# Patient Record
Sex: Female | Born: 1935 | Race: White | Hispanic: No | Smoking: Never smoker
Health system: Southern US, Community
[De-identification: ages and names within clinical notes are randomized; demographics above are authoritative.]

## PROBLEM LIST (undated history)

## (undated) DIAGNOSIS — R42 Dizziness and giddiness: Secondary | ICD-10-CM

## (undated) DIAGNOSIS — C801 Malignant (primary) neoplasm, unspecified: Secondary | ICD-10-CM

## (undated) DIAGNOSIS — I10 Essential (primary) hypertension: Secondary | ICD-10-CM

## (undated) DIAGNOSIS — K2970 Gastritis, unspecified, without bleeding: Secondary | ICD-10-CM

## (undated) DIAGNOSIS — N952 Postmenopausal atrophic vaginitis: Secondary | ICD-10-CM

## (undated) DIAGNOSIS — J302 Other seasonal allergic rhinitis: Secondary | ICD-10-CM

## (undated) DIAGNOSIS — I639 Cerebral infarction, unspecified: Secondary | ICD-10-CM

## (undated) DIAGNOSIS — E119 Type 2 diabetes mellitus without complications: Secondary | ICD-10-CM

## (undated) DIAGNOSIS — I350 Nonrheumatic aortic (valve) stenosis: Secondary | ICD-10-CM

## (undated) DIAGNOSIS — M549 Dorsalgia, unspecified: Secondary | ICD-10-CM

## (undated) HISTORY — PX: TONSILLECTOMY: SUR1361

## (undated) HISTORY — PX: BACK SURGERY: SHX140

## (undated) HISTORY — PX: BREAST BIOPSY: SHX20

## (undated) HISTORY — DX: Postmenopausal atrophic vaginitis: N95.2

## (undated) HISTORY — PX: ABDOMINAL HYSTERECTOMY: SHX81

## (undated) HISTORY — PX: COLON SURGERY: SHX602

---

## 2004-12-06 ENCOUNTER — Ambulatory Visit: Payer: Self-pay | Admitting: Urology

## 2006-01-03 ENCOUNTER — Ambulatory Visit: Payer: Self-pay | Admitting: Internal Medicine

## 2007-01-06 ENCOUNTER — Ambulatory Visit: Payer: Self-pay | Admitting: Internal Medicine

## 2007-12-25 ENCOUNTER — Ambulatory Visit: Payer: Self-pay | Admitting: Unknown Physician Specialty

## 2008-01-08 ENCOUNTER — Ambulatory Visit: Payer: Self-pay | Admitting: Internal Medicine

## 2008-07-07 ENCOUNTER — Emergency Department: Payer: Self-pay | Admitting: Emergency Medicine

## 2009-02-03 ENCOUNTER — Ambulatory Visit: Payer: Self-pay | Admitting: Internal Medicine

## 2010-02-16 ENCOUNTER — Ambulatory Visit: Payer: Self-pay | Admitting: Internal Medicine

## 2011-04-17 ENCOUNTER — Ambulatory Visit: Payer: Self-pay | Admitting: Internal Medicine

## 2011-05-12 ENCOUNTER — Inpatient Hospital Stay: Payer: Self-pay | Admitting: Internal Medicine

## 2011-05-15 ENCOUNTER — Ambulatory Visit: Payer: Self-pay | Admitting: Physician Assistant

## 2011-06-05 ENCOUNTER — Ambulatory Visit: Payer: Self-pay | Admitting: Unknown Physician Specialty

## 2011-06-14 ENCOUNTER — Ambulatory Visit: Payer: Self-pay | Admitting: Internal Medicine

## 2011-09-19 ENCOUNTER — Observation Stay: Payer: Self-pay | Admitting: Internal Medicine

## 2011-09-19 LAB — CBC
MCH: 25.1 pg — ABNORMAL LOW (ref 26.0–34.0)
MCHC: 31.9 g/dL — ABNORMAL LOW (ref 32.0–36.0)
MCV: 79 fL — ABNORMAL LOW (ref 80–100)
Platelet: 233 10*3/uL (ref 150–440)
RBC: 2.94 10*6/uL — ABNORMAL LOW (ref 3.80–5.20)
RDW: 16.7 % — ABNORMAL HIGH (ref 11.5–14.5)

## 2011-09-19 LAB — COMPREHENSIVE METABOLIC PANEL
Anion Gap: 10 (ref 7–16)
Bilirubin,Total: 0.3 mg/dL (ref 0.2–1.0)
Chloride: 107 mmol/L (ref 98–107)
Co2: 25 mmol/L (ref 21–32)
EGFR (African American): >60
EGFR (Non-African Amer.): 50 — ABNORMAL LOW
Glucose: 190 mg/dL — ABNORMAL HIGH (ref 65–99)
Potassium: 3.7 mmol/L (ref 3.5–5.1)
SGOT(AST): 35 U/L (ref 15–37)
SGPT (ALT): 20 U/L
Sodium: 142 mmol/L (ref 136–145)
Total Protein: 6.6 g/dL (ref 6.4–8.2)

## 2011-09-20 LAB — URINALYSIS, COMPLETE
Blood: NEGATIVE
Glucose,UR: NEGATIVE mg/dL (ref 0–75)
Ketone: NEGATIVE
Nitrite: NEGATIVE
Protein: NEGATIVE
Specific Gravity: 1.014 (ref 1.003–1.030)
WBC UR: 4 /HPF (ref 0–5)

## 2011-09-20 LAB — HEMOGLOBIN: HGB: 9.5 g/dL — ABNORMAL LOW (ref 12.0–16.0)

## 2011-09-20 LAB — HEMATOCRIT: HCT: 29.5 % — ABNORMAL LOW (ref 35.0–47.0)

## 2012-04-29 ENCOUNTER — Ambulatory Visit: Payer: Self-pay | Admitting: Internal Medicine

## 2012-12-24 ENCOUNTER — Ambulatory Visit: Payer: Self-pay | Admitting: Internal Medicine

## 2013-03-30 ENCOUNTER — Inpatient Hospital Stay: Payer: Self-pay | Admitting: Internal Medicine

## 2013-03-30 ENCOUNTER — Ambulatory Visit: Payer: Self-pay | Admitting: Internal Medicine

## 2013-03-30 LAB — CBC
HCT: 39.5 % (ref 35.0–47.0)
HGB: 13.3 g/dL (ref 12.0–16.0)
MCH: 29.8 pg (ref 26.0–34.0)
MCHC: 33.7 g/dL (ref 32.0–36.0)
MCV: 88 fL (ref 80–100)
Platelet: 196 10*3/uL (ref 150–440)
RDW: 13.2 % (ref 11.5–14.5)
WBC: 8.3 10*3/uL (ref 3.6–11.0)

## 2013-03-30 LAB — COMPREHENSIVE METABOLIC PANEL
Albumin: 3.8 g/dL (ref 3.4–5.0)
Anion Gap: 7 (ref 7–16)
BUN: 25 mg/dL — ABNORMAL HIGH (ref 7–18)
Bilirubin,Total: 0.4 mg/dL (ref 0.2–1.0)
Calcium, Total: 9.8 mg/dL (ref 8.5–10.1)
Chloride: 101 mmol/L (ref 98–107)
Co2: 26 mmol/L (ref 21–32)
EGFR (African American): 45 — ABNORMAL LOW
EGFR (Non-African Amer.): 38 — ABNORMAL LOW
Glucose: 135 mg/dL — ABNORMAL HIGH (ref 65–99)
SGOT(AST): 40 U/L — ABNORMAL HIGH (ref 15–37)
SGPT (ALT): 18 U/L (ref 12–78)
Sodium: 134 mmol/L — ABNORMAL LOW (ref 136–145)
Total Protein: 7.6 g/dL (ref 6.4–8.2)

## 2013-03-30 LAB — PROTIME-INR: INR: 1

## 2013-03-30 LAB — CREATININE, SERUM: EGFR (African American): 41 — ABNORMAL LOW

## 2013-03-30 LAB — APTT: Activated PTT: 32.7 secs (ref 23.6–35.9)

## 2013-03-31 LAB — URINALYSIS, COMPLETE
Bilirubin,UR: NEGATIVE
Nitrite: NEGATIVE
Ph: 6 (ref 4.5–8.0)
RBC,UR: 1 /HPF (ref 0–5)
Specific Gravity: 1.009 (ref 1.003–1.030)
Squamous Epithelial: NONE SEEN
WBC UR: 1 /HPF (ref 0–5)

## 2013-03-31 LAB — APTT: Activated PTT: 104.9 secs — ABNORMAL HIGH (ref 23.6–35.9)

## 2013-04-01 LAB — CBC WITH DIFFERENTIAL/PLATELET
Eosinophil #: 0.4 10*3/uL (ref 0.0–0.7)
Eosinophil %: 5.6 %
HCT: 33.3 % — ABNORMAL LOW (ref 35.0–47.0)
HGB: 11.4 g/dL — ABNORMAL LOW (ref 12.0–16.0)
Lymphocyte #: 2 10*3/uL (ref 1.0–3.6)
Lymphocyte %: 26.1 %
MCHC: 34.3 g/dL (ref 32.0–36.0)
Monocyte #: 0.7 x10 3/mm (ref 0.2–0.9)
Monocyte %: 8.8 %
Platelet: 169 10*3/uL (ref 150–440)

## 2013-04-01 LAB — APTT: Activated PTT: 127.6 secs — ABNORMAL HIGH (ref 23.6–35.9)

## 2013-04-01 LAB — BASIC METABOLIC PANEL
Anion Gap: 7 (ref 7–16)
BUN: 26 mg/dL — ABNORMAL HIGH (ref 7–18)
Calcium, Total: 8.9 mg/dL (ref 8.5–10.1)
Chloride: 106 mmol/L (ref 98–107)
Co2: 25 mmol/L (ref 21–32)
Creatinine: 1.08 mg/dL (ref 0.60–1.30)
EGFR (African American): 53 — ABNORMAL LOW
EGFR (Non-African Amer.): 45 — ABNORMAL LOW
Glucose: 98 mg/dL (ref 65–99)
Potassium: 4.1 mmol/L (ref 3.5–5.1)
Sodium: 138 mmol/L (ref 136–145)

## 2013-04-21 ENCOUNTER — Ambulatory Visit: Payer: Self-pay | Admitting: Vascular Surgery

## 2013-04-21 LAB — BASIC METABOLIC PANEL
Anion Gap: 7 (ref 7–16)
BUN: 24 mg/dL — ABNORMAL HIGH (ref 7–18)
Calcium, Total: 9.4 mg/dL (ref 8.5–10.1)
Chloride: 99 mmol/L (ref 98–107)
Creatinine: 1.27 mg/dL (ref 0.60–1.30)
Glucose: 162 mg/dL — ABNORMAL HIGH (ref 65–99)
Osmolality: 274 (ref 275–301)
Sodium: 133 mmol/L — ABNORMAL LOW (ref 136–145)

## 2013-05-29 ENCOUNTER — Ambulatory Visit: Payer: Self-pay | Admitting: Internal Medicine

## 2013-06-08 ENCOUNTER — Emergency Department: Payer: Self-pay | Admitting: Emergency Medicine

## 2013-06-19 ENCOUNTER — Ambulatory Visit: Payer: Self-pay | Admitting: Physician Assistant

## 2013-12-21 ENCOUNTER — Ambulatory Visit: Payer: Self-pay | Admitting: Unknown Physician Specialty

## 2014-07-02 ENCOUNTER — Ambulatory Visit: Payer: Self-pay | Admitting: Internal Medicine

## 2014-09-06 DIAGNOSIS — I6529 Occlusion and stenosis of unspecified carotid artery: Secondary | ICD-10-CM | POA: Insufficient documentation

## 2014-12-31 NOTE — Discharge Summary (Signed)
PATIENT NAME:  Katie, Graves MR#:  [id number] DATE OF BIRTH:  07-02-1922  DATE OF ADMISSION:  03/30/2013 DATE OF DISCHARGE:  04/01/2013  DIAGNOSES AT TIME OF DISCHARGE:  1. Acute cerebrovascular accident with multiple infarcts involving pons, right thalamus and right frontoparietal lobe.  2. Hypertension.  3. Type 2 diabetes.  4. Carotid stenosis.  5. History of aortic valve disease.   CHIEF COMPLAINT: Posterior headache, weakness, dizziness.   HISTORY OF PRESENT ILLNESS: Katie Graves is an 81 year old female who was recently seen in the clinic complaining of dizziness. The patient was treated symptomatically for this, but later developed a posterior headache, more on the left side, and a subsequent MRI done as an outpatient showed evidence of acute small infarcts involving the pons, right thalamus and subcortical white matter of the right frontoparietal lobe. The patient was noted to have carotid artery disease, and ultrasound done by Dr. Lucky Cowboy in January had shown 70% stenosis bilaterally. The patient also had a history of occlusion of the basilar artery, and subsequently had a repeat carotid Doppler last week.   PAST MEDICAL HISTORY: Significant for: 1. Hypertension.  2. Type 2 diabetes.  3. Hyperlipidemia.  4. Aortic stenosis.  5. Previous CVA. 6. Previous GI bleed.   PHYSICAL EXAMINATION:  VITAL SIGNS: Temperature was 98.1, pulse was 70, respirations 20, blood pressure 218/104. GENERAL: She appeared anxious.  HEENT: [place], [place]. NECK: Supple. No JVD.  HEART: S1, S2. A 3/6 systolic murmur noted in the aortic area.  ABDOMEN: Soft, nontender.  EXTREMITIES: No edema.  NEUROLOGIC: Alert and oriented. No obvious focal neurological deficits.   HOSPITAL COURSE: The patient was admitted to [facility] and started on IV heparin drip. She was also seen in consultation by Dr. Delana Meyer, who felt that she would benefit from a CT angiogram. Unfortunately, her serum creatinine was elevated at 1.32 with an  EGFR of 38. This subsequently improved to GFR of 45, but radiologist felt that the carotid angiogram should be delayed by a couple of days. During her stay in hospital, the patient received IV heparin. She was continued on her home blood pressure medications. She did not have any further episodes. She did have some numbness of the right lower extremity which resolved. Blood pressure remained stable, and renal function improved with a creatinine 1.08.   DISCHARGE MEDICATIONS: She was discharged in stable condition on the following medications: 1. Verapamil 240 mg extended-release once a day.  2. Glimepiride 2 mg once a day.  3. Valsartan 320 mg once a day.  4. Atorvastatin 10 mg once a day.  5. Fluticasone nasal spray 2 sprays once a day. 6. Plavix 75 mg a day.  7. Ferrous sulfate 325 mg once a day. 8. Ventolin HFA 2 puffs every 4 to 6 hours p.r.n. 9. Hydralazine 25 mg b.i.d.  10. Travatan eyedrops 1 drop to each eye once a day.  11. Omeprazole 20 mg 1 capsule b.i.d.   DISCHARGE INSTRUCTIONS: The patient was advised low sodium diet. A CT angiogram has been scheduled for her on 04/03/2013. She will follow up with me, Dr. Ginette Pitman, and also follow up with Dr. Delana Meyer in his office in 1 to 2 weeks' time. The patient has been advised to call back or report to the ER if she notices any weakness or further neurological events. The patient is stable at the time of discharge.     Total time spent in discharge of patient; 35 minutes  ____________________________ Tracie Harrier, MD vh:OSi D: 04/02/2013  08:21:43 ET T: 04/02/2013 09:40:37 ET JOB#: [id number]  cc: Tracie Harrier, MD, <Dictator> Tracie Harrier MD ELECTRONICALLY SIGNED 04/02/2013 18:22

## 2014-12-31 NOTE — Consult Note (Signed)
Brief Consult Note: Diagnosis: CVA, carotid stensosis.   Patient was seen by consultant.   Recommend further assessment or treatment.   Comments: will get CT angiogram.  Electronic Signatures: Hortencia Pilar (MD)  (Signed 22-Jul-14 17:44)  Authored: Brief Consult Note   Last Updated: 22-Jul-14 17:44 by Hortencia Pilar (MD)

## 2014-12-31 NOTE — H&P (Signed)
PATIENT NAME:  Katie Graves, Katie Graves MR#:  [id number] DATE OF BIRTH:  [birth year]  DATE OF ADMISSION:  03/30/2013  HISTORY OF PRESENT ILLNESS:  Ms. Caratachea is an 81 year old white lady who had been seen last week in the office by Dr. Ginette Pitman complaining of dizziness. She was treated symptomatically for what was felt to be labyrinthitis. Over the weekend, she got worse and developed a posterior headache. She had called and they scheduled an outpatient MRI which was done late this afternoon. It showed small acute infarcts in the pons, right thalamus and subcortical white matter of the right frontoparietal lobe. The patient is therefore being admitted for acute CVA.    It is noted that the patient has known carotid artery disease. She had an ultrasound by Dr. Lucky Cowboy back in January that showed about 70% stenosis bilaterally. She also has a history of complete occlusion of the basilar artery. The patient states she had a followup carotid Doppler last week but does not know the results.   PAST MEDICAL HISTORY:  The patient's past medical history is notable for hypertension, type 2 diabetes, hyperlipidemia, aortic stenosis probable previous CVA, previous upper GI bleed from gastritis and previous colon cancer resection in 1987.   PAST SURGICAL HISTORY:  The patient's surgical history includes a previous laminectomy, total abdominal hysterectomy and colon resection.   MEDICATIONS: The patient's medications at the time of admission include:  1.  Travatan Z drops 0.004%, 1 drop both eyes once a day.  2.  Hydralazine 25 mg b.i.d.  3.  Ventolin metered dose inhaler 2 puffs q.4 to 6 hours as needed.  4.  Omeprazole 20 mg b.i.d.  5.  Diovan/hydrochlorothiazide 320/12.5 mg daily.  6.  Verapamil extended-release 240 mg daily.  7.  Glimepiride 2 mg daily.  8.  Plavix 75 mg daily.  9.  Lipitor 10 mg daily.   ALLERGIES: THE PATIENT IS ALLERGIC TO PENICILLIN, COZAAR, VANCOMYCIN, LIDOCAINE, SULFA AND CODEINE.   FAMILY HISTORY:  Positive for heart disease.   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 She does not smoke or drink.   REVIEW OF SYSTEMS: Essentially unremarkable as per the ER questionnaire with the exception of the present illness.   ADMISSION PHYSICAL EXAMINATION VITAL SIGNS: Temperature 98.1, pulse 70, respirations 20 and blood pressure 218/104.  GENERAL: This is an elderly lady who is in no acute distress but does appear extremely anxious.  SKIN: Normal in color and texture. There is no lymphadenopathy.  HEENT: Examination of head, ears, eyes, nose and throat reveals she has bilateral hearing aids. Pupils are equal, round, reactive to light and accommodation. Extraocular movements are intact. Fundi shows arterial narrowing but no hemorrhages or exudates. No nystagmus was noted. The nose and throat were clear.  NECK: Supple. Thyroid was not palpable. There was no JVD. Both carotid pulsations were decreased in intensity but no bruits were appreciated.  LUNGS: Clear to auscultation.  CARDIAC: Regular rhythm with a grade 3 to 4/6 aortic stenotic murmur. There were no gallops. S1 and S2 were normal.  BREASTS: Not examined.  ABDOMEN: Soft and nontender. Liver and spleen are not enlarged. Bowel sounds were normal.  PELVIC: Deferred.  RECTAL:  Deferred.  EXTREMITIES: No edema or deformity.  NEUROLOGIC EXAM: Shows cranial nerves to be grossly intact. The patient moved all extremities well. There are no Babinski's.   IMPRESSION: Acute cerebrovascular accident with multiple infarcts involving the pons, right thalamus and subcortical white matter of  the right frontoparietal lobe.   PLAN:  The patient is being admitted to telemetry to observe for the possibility of arrhythmias  as a source for multiple emboli. She is also being started on IV heparin drip. Plavix will be held. Dr. Lucky Cowboy will consulted regarding her most recent carotid ultrasound. The patient will be  continued on a low sodium, diabetic diet. Her preadmission medications were continued without change.   ____________________________ Hewitt Blade. Sarina Ser, MD jbw:cs D: 03/30/2013 20:14:00 ET T: 03/30/2013 20:25:43 ET JOB#: [id number]  cc: Hewitt Blade. Sarina Ser, MD, <Dictator> Lottie Mussel III MD ELECTRONICALLY SIGNED 04/01/2013 8:03

## 2014-12-31 NOTE — Op Note (Signed)
PATIENT NAME:  Katie Graves, Katie Graves MR#:  [id number] DATE OF BIRTH:  1921-12-02  DATE OF PROCEDURE:  04/22/2013  PREOPERATIVE DIAGNOSES:  1.  Multiple acute infarcts, pontine, thalamic and right temporal.   2.  Carotid stenosis.   3.  Basilar artery stenosis.  POSTOPERATIVE DIAGNOSES:   1.  Multiple acute infarcts, pontine, thalamic and right temporal.   2.  Carotid stenosis.   3.  Basilar artery stenosis.  PROCEDURE PERFORMED:   1.  Arch aortogram.  2.  Selective injection of the right common carotid artery; cervical and cerebral views.  3.  Selective injection of the left subclavian artery.   SURGEON: Hortencia Pilar, M.D.   SEDATION: Versed 3 mg plus fentanyl 100 mcg administered IV. Continuous ECG, pulse oximetry and cardiopulmonary monitoring is performed throughout the entire procedure by the interventional radiology nurse.   TOTAL SEDATION TIME: Approximately 50 minutes.   ACCESS: A 5 French sheath, right common femoral artery.   FLUOROSCOPY TIME: 4.7 minutes.   CONTRAST USED: Isovue 45 mL.   INDICATIONS: Katie Graves is an 81 year old woman who was admitted to the hospital several weeks ago with new onset stroke-like symptoms. MRI at that time demonstrated three acute infarcts as noted above. Her creatinine is somewhat elevated and her GFR is quite reduced and therefore contrast enhanced MRI and/or CT angiography was not feasible. Subsequently, she is now returned for conventional angiography to define whether she has surgically operative lesions. The risks and benefits were reviewed. All questions answered. The patient has agreed to proceed.   DESCRIPTION OF PROCEDURE: The patient is taken to special procedures and placed in the supine position. After adequate sedation is achieved, her groins are prepped and draped in sterile fashion. Ultrasound  was placed in  a sterile sleeve. Ultrasound is utilized secondary to lack of appropriate landmarks and to avoid vascular injury. Under  direct ultrasound visualization, access is obtained to the right common femoral artery. The common femoral artery is noted to be echolucent and pulsatile, indicating patency. Image is recorded for the permanent record and after 1% lidocaine is infiltrated into the soft tissues, the puncture is made under direct ultrasound visualization. Microwire followed by microsheath, J-wire followed by a 5 French sheath and 5 French pigtail catheter.   The pigtail catheter is advanced to the ascending aorta and an LAO projection of the arch is obtained. After review of the image the JB-1 catheter was used in exchange for the pigtail catheter over a stiff angled Glidewire and the catheter is used to engage the innominate and then the wire catheter combination is negotiated into the common carotid on the right. AP, oblique as well as lateral views of the cervical carotid are obtained Waters and lateral views of the intracranial arteries are then obtained. After review of these images, the catheter is removed over wire and the left subclavian is engaged. A left subclavian is then injected to evaluate the vertebral artery on the left. After review of these images the catheter is removed over a wire. An oblique view of the right groin is obtained and subsequently a Mynx device is deployed without difficulty and there is an excellent hemostasis. There were no immediate complications.   INTERPRETATION: The thoracic arch is opacified with contrast. The arch is widely patent. The origins of the innominate as well as of the common carotid arteries are widely patent. Further images demonstrate the origin in the proximal common carotid on the right is widely patent. In the AP projection, the  right common carotid artery overlaps the external and it is nondiagnostic. The visualized portions of the internal carotid artery as well as the common carotid artery are widely patent. The oblique view again, the internal and external at the  bifurcation are overlapped, but the  remaining visualized portions of the internal as well as the siphon middle cerebral and anterior cerebral are filled at the level of the siphon. There is approximately a 25% to 30% narrowing, but this is not hemodynamically significant. Intracranial waters view demonstrates patency of the middle cerebral, anterior cerebral fills, but it appears to have small gaps consistent with intrinsic intracranial stenosis of the anterior cerebral. Lateral view demonstrates good arborization of the internal and patency of the distal again at the siphon. There appears to be a 30% stenosis of the distal internal carotid. In the lateral view of the right carotid at the bifurcation, there appears to be a 30% to 40% stenosis in the proximal internal carotid artery distally. The internal carotid artery is widely patent. It does not appear to have a large ulcerative aspect. Right common carotid was then imaged in an LAO projection and this imaged shows approximately a 50% stenosis again. Although there is shelf, there does not appear to be any true ulcerative aspect to this lesion.   The left subclavian is then selected and imaged and shows the origin to be widely patent. There is 15% to 20% atherosclerotic plaque, 1 to 2 cm above the origin. Otherwise, the subclavian is quite smooth. There is no evidence of hemodynamically significant stenosis from the origin of the subclavian up to the vertebral. The vertebral is widely patent at its origin, but is quite small. On initial views of the innominate on the right, there was a very little filling of the right vertebral. The patient has a known basilar artery stenosis that was not elected to have been treated at [facility] and therefore, selective injections of the vertebrals were not indicated.   SUMMARY: The patient has a 50% stenosis at the origin of the right internal carotid artery. There is also noted distal intracranial stenosis, particularly within  the anterior cerebral. The distal internal carotid artery at the level of the siphon has approximately a 30% stenosis.   The subclavian is widely patent, as is the origin and left vertebral, although notation is made the vertebral is rather small.    ____________________________ Katha Cabal, MD ggs:cc D: 04/22/2013 14:48:03 ET T: 04/22/2013 17:25:17 ET JOB#: [id number]  cc: Katha Cabal, MD, <Dictator> Ceasar Lund. Anselm Jungling, MD Katha Cabal MD ELECTRONICALLY SIGNED 04/29/2013 7:27

## 2015-01-02 NOTE — H&P (Signed)
PATIENT NAME:  Katie Graves, Katie Graves MR#:  [id number] DATE OF BIRTH:  01-04-1922  DATE OF ADMISSION:  09/19/2011  HISTORY OF PRESENT ILLNESS: Ms. Rotan is an 81 year old white lady who had recently been evaluated [facility] for both carotid and basilar artery stenosis. She had been placed on Plavix this past fall. She was seen approximately 2 to 3 weeks ago in the office at which time it was noted that her hemoglobin had dropped down to 8.5. She was scheduled to be seen in GI because of a positive Hemoccult but could not be seen until the end of January. Because of the long delay she was brought back for a follow-up CBC today. It showed that her hemoglobin had dropped to 7.4. The patient is being admitted to observation for transfusion.   PAST MEDICAL HISTORY:  1. Cerebrovascular disease.  2. History of previous upper GI bleed.  3. She is known to be H. pylori positive.  4. History of cancer of the colon.   5. Hypertension.  6. Hyperlipidemia.  7. Type 2 diabetes.  8. Aortic stenosis.   PAST SURGICAL HISTORY:  1. Laminectomy. 2. Total abdominal hysterectomy. 3. Colon resection.   ALLERGIES: Patient is allergic to penicillin, Vancocin and lidocaine.   MEDICATIONS: 1. Travoprost eyedrops 1 drop each eye every evening. 2. Verapamil extended release 240 mg daily.  3. Losartan 100 mg daily.  4. Hydralazine 25 mg b.i.d.  5. Ventolin 2 puffs every four hours p.r.n.  6. Amaryl 2 mg each morning.  7. Flonase 1 puff each nostril once a day.  8. Lipitor 40 mg 3 times a week.  9. Plavix 75 mg daily.  10. Aspirin 71 mg daily.  11. Zantac 75 mg daily.   FAMILY HISTORY/SOCIAL HISTORY: Noncontributory.   PHYSICAL EXAMINATION:  VITAL SIGNS: Normal vital signs.   GENERAL: The patient is alert, in no acute distress. Despite her known anemia she does not look particularly pale. There are no rashes. There is no lymphadenopathy. There is no petechiae.   HEENT: Examination of head,  ears, eyes, nose and throat was grossly within normal limits.   NECK: The neck was supple. There was no JVD. The carotid pulses are diminished but I do not hear any bruits.   LUNGS: The lungs are clear to auscultation and percussion.   CARDIAC: Reveals a regular rhythm with a grade 1 to 2/6 systolic ejection murmur. There are no gallops.   ABDOMEN: Soft and nontender. The liver and spleen are not enlarged. Bowel sounds were normal.   BREAST/PELVIC/RECTAL: Deferred.   EXTREMITIES: No edema or deformity.   NEUROLOGIC: Physiological.   PLAN: The patient is being admitted to observation where she will be transfused with 2 units of packed cells. Hemoglobin and hematocrit will be repeated in the a.m.  ____________________________ Hewitt Blade. Sarina Ser, MD jbw:cms D: 09/19/2011 20:25:33 ET T: 09/20/2011 05:32:03 ET  JOB#: [id number] Lottie Mussel III MD ELECTRONICALLY SIGNED 09/28/2011 13:38

## 2015-01-02 NOTE — Discharge Summary (Signed)
PATIENT NAME:  Katie Graves, Katie Graves MR#:  [id number] DATE OF BIRTH:  08/05/1922  DATE OF ADMISSION:  09/19/2011 DATE OF DISCHARGE:  09/20/2011  DISCHARGE DIAGNOSES:  1. Anemia secondary to gastrointestinal blood loss. The patient was admitted for transfusion. 2. History of previous cancer of the colon. 3. History of hypertension. 4. Previous cerebrovascular accident.   HISTORY OF PRESENT ILLNESS: Katie Graves is an 81 year old female who had recently been evaluated at [facility] for carotid and basilar artery stenosis. At that time she had been placed on Plavix, but was noted to have upper GI symptoms and also a drop in her hemoglobin to 8.5. Repeat hemoglobin done the day of admission showed further drop to 7.4 and she was admitted for blood transfusion.   PAST MEDICAL HISTORY:  1. Cerebrovascular accident. 2. History of previous GI bleed.  3. History of Helicobacter pylori positive.  4. History of cancer of the colon. 5. Hypertension.  6. Hyperlipidemia.  7. Type 2 diabetes.  8. Aortic stenosis.   PHYSICAL EXAMINATION: On physical examination, she appeared to be stable, not in distress. HEENT was normocephalic, atraumatic. Neck was supple. No JVD. Lungs were clear to auscultation. Heart had S1 and S2, grade 2/6 systolic ejection murmur. Abdomen was soft and nontender. Extremities had no edema. Neurological examination was nonfocal.   LABS/STUDIES: Hemoglobin 7.4, WBC count 7.1, hematocrit 23.1. Glucose 190, BUN 30, creatinine 1.1, sodium 142, potassium 3.7, chloride 107, CO2 25, and calcium 9.0. Post-transfusion hemoglobin was 9.5.   Urinalysis was negative.   HOSPITAL COURSE: Following transfusion the patient symptomatically improved and was discharged home in stable condition on the following day. She has a GI appointment has been advised to keep this appointment with Dr. Vira Agar as scheduled. She has been advised to hold her aspirin and Plavix for now and also advised omeprazole 20  mg p.o. twice a day, in addition to her other medications, including hydralazine 25 mg p.o. three times daily, Losartan 100 mg a day, Verapamil 240 mg once a day, Glimepiride 4 mg once a day, and Allegra 180 mg p.o. daily.   FOLLOWUP: The patient has been advised to follow with me, Dr. Tracie Harrier, in 1 to 2 weeks' time. ____________________________ Tracie Harrier, MD vh:slb D: 09/24/2011 18:16:44 ET T: 09/26/2011 07:12:53 ET JOB#: [id number]  cc: Tracie Harrier, MD, <Dictator> Tracie Harrier MD ELECTRONICALLY SIGNED 10/25/2011 12:34

## 2015-01-26 ENCOUNTER — Ambulatory Visit
Admission: RE | Admit: 2015-01-26 | Discharge: 2015-01-26 | Disposition: A | Discharge status: Home / Self Care | Payer: Medicare Other | Source: Ambulatory Visit | Attending: Internal Medicine | Admitting: Internal Medicine

## 2015-01-26 ENCOUNTER — Other Ambulatory Visit: Payer: Self-pay | Admitting: Internal Medicine

## 2015-01-26 DIAGNOSIS — R600 Localized edema: Secondary | ICD-10-CM

## 2015-06-19 ENCOUNTER — Observation Stay
Admission: EM | Admit: 2015-06-19 | Discharge: 2015-06-21 | Disposition: A | Discharge status: Home / Self Care | Payer: Medicare Other | Attending: Internal Medicine | Admitting: Internal Medicine

## 2015-06-19 ENCOUNTER — Emergency Department: Payer: Medicare Other

## 2015-06-19 ENCOUNTER — Encounter: Payer: Self-pay | Admitting: Emergency Medicine

## 2015-06-19 DIAGNOSIS — Z9841 Cataract extraction status, right eye: Secondary | ICD-10-CM | POA: Insufficient documentation

## 2015-06-19 DIAGNOSIS — J42 Unspecified chronic bronchitis: Secondary | ICD-10-CM | POA: Diagnosis not present

## 2015-06-19 DIAGNOSIS — I10 Essential (primary) hypertension: Secondary | ICD-10-CM | POA: Diagnosis not present

## 2015-06-19 DIAGNOSIS — Z884 Allergy status to anesthetic agent status: Secondary | ICD-10-CM | POA: Diagnosis not present

## 2015-06-19 DIAGNOSIS — E042 Nontoxic multinodular goiter: Secondary | ICD-10-CM | POA: Insufficient documentation

## 2015-06-19 DIAGNOSIS — I651 Occlusion and stenosis of basilar artery: Secondary | ICD-10-CM | POA: Diagnosis present

## 2015-06-19 DIAGNOSIS — Z9842 Cataract extraction status, left eye: Secondary | ICD-10-CM | POA: Insufficient documentation

## 2015-06-19 DIAGNOSIS — Z880 Allergy status to penicillin: Secondary | ICD-10-CM | POA: Diagnosis not present

## 2015-06-19 DIAGNOSIS — Z8673 Personal history of transient ischemic attack (TIA), and cerebral infarction without residual deficits: Secondary | ICD-10-CM | POA: Insufficient documentation

## 2015-06-19 DIAGNOSIS — R55 Syncope and collapse: Secondary | ICD-10-CM | POA: Diagnosis not present

## 2015-06-19 DIAGNOSIS — R42 Dizziness and giddiness: Secondary | ICD-10-CM | POA: Insufficient documentation

## 2015-06-19 DIAGNOSIS — M50322 Other cervical disc degeneration at C5-C6 level: Secondary | ICD-10-CM | POA: Insufficient documentation

## 2015-06-19 DIAGNOSIS — R918 Other nonspecific abnormal finding of lung field: Secondary | ICD-10-CM | POA: Insufficient documentation

## 2015-06-19 DIAGNOSIS — I6523 Occlusion and stenosis of bilateral carotid arteries: Secondary | ICD-10-CM

## 2015-06-19 DIAGNOSIS — Z7982 Long term (current) use of aspirin: Secondary | ICD-10-CM | POA: Diagnosis not present

## 2015-06-19 DIAGNOSIS — Z882 Allergy status to sulfonamides status: Secondary | ICD-10-CM | POA: Insufficient documentation

## 2015-06-19 DIAGNOSIS — Z90710 Acquired absence of both cervix and uterus: Secondary | ICD-10-CM | POA: Insufficient documentation

## 2015-06-19 DIAGNOSIS — Z79899 Other long term (current) drug therapy: Secondary | ICD-10-CM | POA: Insufficient documentation

## 2015-06-19 DIAGNOSIS — M25511 Pain in right shoulder: Secondary | ICD-10-CM | POA: Diagnosis not present

## 2015-06-19 DIAGNOSIS — Z23 Encounter for immunization: Secondary | ICD-10-CM | POA: Diagnosis not present

## 2015-06-19 DIAGNOSIS — I38 Endocarditis, valve unspecified: Secondary | ICD-10-CM | POA: Diagnosis not present

## 2015-06-19 DIAGNOSIS — E119 Type 2 diabetes mellitus without complications: Secondary | ICD-10-CM | POA: Diagnosis not present

## 2015-06-19 DIAGNOSIS — Z7902 Long term (current) use of antithrombotics/antiplatelets: Secondary | ICD-10-CM | POA: Diagnosis not present

## 2015-06-19 DIAGNOSIS — I739 Peripheral vascular disease, unspecified: Secondary | ICD-10-CM | POA: Diagnosis not present

## 2015-06-19 DIAGNOSIS — I959 Hypotension, unspecified: Secondary | ICD-10-CM | POA: Insufficient documentation

## 2015-06-19 DIAGNOSIS — Z8249 Family history of ischemic heart disease and other diseases of the circulatory system: Secondary | ICD-10-CM | POA: Diagnosis not present

## 2015-06-19 DIAGNOSIS — Z794 Long term (current) use of insulin: Secondary | ICD-10-CM | POA: Diagnosis not present

## 2015-06-19 DIAGNOSIS — M4802 Spinal stenosis, cervical region: Secondary | ICD-10-CM | POA: Diagnosis not present

## 2015-06-19 DIAGNOSIS — I350 Nonrheumatic aortic (valve) stenosis: Secondary | ICD-10-CM | POA: Insufficient documentation

## 2015-06-19 DIAGNOSIS — F419 Anxiety disorder, unspecified: Secondary | ICD-10-CM | POA: Insufficient documentation

## 2015-06-19 DIAGNOSIS — I771 Stricture of artery: Secondary | ICD-10-CM | POA: Diagnosis not present

## 2015-06-19 DIAGNOSIS — M50323 Other cervical disc degeneration at C6-C7 level: Secondary | ICD-10-CM | POA: Insufficient documentation

## 2015-06-19 DIAGNOSIS — M25512 Pain in left shoulder: Secondary | ICD-10-CM | POA: Diagnosis not present

## 2015-06-19 HISTORY — DX: Essential (primary) hypertension: I10

## 2015-06-19 HISTORY — DX: Cerebral infarction, unspecified: I63.9

## 2015-06-19 HISTORY — DX: Dorsalgia, unspecified: M54.9

## 2015-06-19 HISTORY — DX: Malignant (primary) neoplasm, unspecified: C80.1

## 2015-06-19 HISTORY — DX: Gastritis, unspecified, without bleeding: K29.70

## 2015-06-19 HISTORY — DX: Dizziness and giddiness: R42

## 2015-06-19 HISTORY — DX: Nonrheumatic aortic (valve) stenosis: I35.0

## 2015-06-19 HISTORY — DX: Other seasonal allergic rhinitis: J30.2

## 2015-06-19 HISTORY — DX: Type 2 diabetes mellitus without complications: E11.9

## 2015-06-19 LAB — BASIC METABOLIC PANEL
Anion gap: 7 (ref 5–15)
BUN: 22 mg/dL — AB (ref 6–20)
CALCIUM: 8.9 mg/dL (ref 8.9–10.3)
CHLORIDE: 107 mmol/L (ref 101–111)
CO2: 25 mmol/L (ref 22–32)
CREATININE: 0.97 mg/dL (ref 0.44–1.00)
GFR calc Af Amer: 57 mL/min — ABNORMAL LOW (ref >60)
GFR calc non Af Amer: 50 mL/min — ABNORMAL LOW (ref >60)
GLUCOSE: 147 mg/dL — AB (ref 65–99)
Potassium: 3.5 mmol/L (ref 3.5–5.1)
Sodium: 139 mmol/L (ref 135–145)

## 2015-06-19 LAB — CBC
HCT: 36.8 % (ref 35.0–47.0)
Hemoglobin: 12.2 g/dL (ref 12.0–16.0)
MCH: 29.5 pg (ref 26.0–34.0)
MCHC: 33.2 g/dL (ref 32.0–36.0)
MCV: 89 fL (ref 80.0–100.0)
PLATELETS: 200 10*3/uL (ref 150–440)
RBC: 4.14 MIL/uL (ref 3.80–5.20)
RDW: 13.5 % (ref 11.5–14.5)
WBC: 7.5 10*3/uL (ref 3.6–11.0)

## 2015-06-19 LAB — URINALYSIS COMPLETE WITH MICROSCOPIC (ARMC ONLY)
Bacteria, UA: NONE SEEN
Bilirubin Urine: NEGATIVE
GLUCOSE, UA: NEGATIVE mg/dL
KETONES UR: NEGATIVE mg/dL
Leukocytes, UA: NEGATIVE
Nitrite: NEGATIVE
PROTEIN: 100 mg/dL — AB
SPECIFIC GRAVITY, URINE: 1.005 (ref 1.005–1.030)
SQUAMOUS EPITHELIAL / LPF: NONE SEEN
pH: 7 (ref 5.0–8.0)

## 2015-06-19 LAB — TROPONIN I

## 2015-06-19 LAB — TSH: TSH: 2.039 u[IU]/mL (ref 0.350–4.500)

## 2015-06-19 MED ORDER — SODIUM CHLORIDE 0.9 % IJ SOLN
3.0000 mL | Freq: Two times a day (BID) | INTRAMUSCULAR | Status: DC
  Administered 2015-06-19 – 2015-06-21 (×4): 3 mL via INTRAVENOUS

## 2015-06-19 MED ORDER — FLUTICASONE PROPIONATE 50 MCG/ACT NA SUSP
2.0000 | Freq: Every day | NASAL | Status: DC
  Administered 2015-06-19 – 2015-06-20 (×2): 2 via NASAL
  Filled 2015-06-19: qty 16

## 2015-06-19 MED ORDER — FUROSEMIDE 40 MG PO TABS: 40.0000 mg | ORAL_TABLET | Freq: Every day | ORAL | Status: DC | PRN

## 2015-06-19 MED ORDER — HYDRALAZINE HCL 20 MG/ML IJ SOLN
10.0000 mg | Freq: Four times a day (QID) | INTRAMUSCULAR | Status: DC | PRN
  Administered 2015-06-19: 10 mg via INTRAVENOUS
  Filled 2015-06-19: qty 1

## 2015-06-19 MED ORDER — LATANOPROST 0.005 % OP SOLN
1.0000 [drp] | Freq: Every day | OPHTHALMIC | Status: DC
  Administered 2015-06-19 – 2015-06-20 (×2): 1 [drp] via OPHTHALMIC
  Filled 2015-06-19: qty 2.5

## 2015-06-19 MED ORDER — IPRATROPIUM BROMIDE 0.03 % NA SOLN
1.0000 | Freq: Three times a day (TID) | NASAL | Status: DC
  Administered 2015-06-19 – 2015-06-21 (×5): 1 via NASAL
  Filled 2015-06-19: qty 30

## 2015-06-19 MED ORDER — ACETAMINOPHEN 325 MG PO TABS
650.0000 mg | ORAL_TABLET | Freq: Four times a day (QID) | ORAL | Status: DC | PRN
  Administered 2015-06-19 – 2015-06-20 (×2): 650 mg via ORAL
  Filled 2015-06-19: qty 2

## 2015-06-19 MED ORDER — LORATADINE 10 MG PO TABS
10.0000 mg | ORAL_TABLET | Freq: Every day | ORAL | Status: DC
  Administered 2015-06-20: 10 mg via ORAL
  Filled 2015-06-19: qty 1

## 2015-06-19 MED ORDER — CLONIDINE HCL 0.1 MG PO TABS
0.1000 mg | ORAL_TABLET | Freq: Four times a day (QID) | ORAL | Status: DC | PRN
  Administered 2015-06-19: 0.1 mg via ORAL
  Filled 2015-06-19: qty 1

## 2015-06-19 MED ORDER — IOHEXOL 350 MG/ML SOLN
75.0000 mL | Freq: Once | INTRAVENOUS | Status: AC | PRN
  Administered 2015-06-19: 75 mL via INTRAVENOUS

## 2015-06-19 MED ORDER — CLOPIDOGREL BISULFATE 75 MG PO TABS
75.0000 mg | ORAL_TABLET | ORAL | Status: DC
  Administered 2015-06-20 – 2015-06-21 (×2): 75 mg via ORAL
  Filled 2015-06-19 (×2): qty 1

## 2015-06-19 MED ORDER — VERAPAMIL HCL ER 180 MG PO TBCR
180.0000 mg | EXTENDED_RELEASE_TABLET | Freq: Every day | ORAL | Status: DC
  Administered 2015-06-19 – 2015-06-20 (×2): 180 mg via ORAL
  Filled 2015-06-19 (×2): qty 1

## 2015-06-19 MED ORDER — HYDRALAZINE HCL 50 MG PO TABS
50.0000 mg | ORAL_TABLET | Freq: Three times a day (TID) | ORAL | Status: DC
  Administered 2015-06-19 – 2015-06-21 (×5): 50 mg via ORAL
  Filled 2015-06-19 (×5): qty 1

## 2015-06-19 MED ORDER — HYDRALAZINE HCL 20 MG/ML IJ SOLN
10.0000 mg | INTRAMUSCULAR | Status: DC | PRN
  Administered 2015-06-20: 10 mg via INTRAVENOUS
  Filled 2015-06-19 (×2): qty 1

## 2015-06-19 MED ORDER — VITAMIN D (ERGOCALCIFEROL) 1.25 MG (50000 UNIT) PO CAPS: 50000.0000 [IU] | ORAL_CAPSULE | ORAL | Status: DC

## 2015-06-19 MED ORDER — LOSARTAN POTASSIUM 50 MG PO TABS
100.0000 mg | ORAL_TABLET | Freq: Every day | ORAL | Status: DC
  Administered 2015-06-20 – 2015-06-21 (×2): 100 mg via ORAL
  Filled 2015-06-19 (×2): qty 2

## 2015-06-19 MED ORDER — METOPROLOL TARTRATE 1 MG/ML IV SOLN
5.0000 mg | Freq: Once | INTRAVENOUS | Status: AC
  Administered 2015-06-19: 5 mg via INTRAVENOUS
  Filled 2015-06-19: qty 5

## 2015-06-19 MED ORDER — GLIMEPIRIDE 2 MG PO TABS
3.0000 mg | ORAL_TABLET | Freq: Every day | ORAL | Status: DC
  Administered 2015-06-20 – 2015-06-21 (×2): 3 mg via ORAL
  Filled 2015-06-19 (×2): qty 2

## 2015-06-19 MED ORDER — ASPIRIN EC 81 MG PO TBEC
81.0000 mg | DELAYED_RELEASE_TABLET | ORAL | Status: DC
  Administered 2015-06-20 – 2015-06-21 (×2): 81 mg via ORAL
  Filled 2015-06-19 (×2): qty 1

## 2015-06-19 MED ORDER — HYDRALAZINE HCL 20 MG/ML IJ SOLN
10.0000 mg | Freq: Once | INTRAMUSCULAR | Status: AC
  Administered 2015-06-19: 10 mg via INTRAVENOUS

## 2015-06-19 MED ORDER — ATORVASTATIN CALCIUM 10 MG PO TABS
10.0000 mg | ORAL_TABLET | ORAL | Status: DC
  Administered 2015-06-20: 10 mg via ORAL
  Filled 2015-06-19: qty 1

## 2015-06-19 MED ORDER — VITAMIN B-12 1000 MCG PO TABS
1000.0000 ug | ORAL_TABLET | Freq: Every day | ORAL | Status: DC
  Administered 2015-06-20 – 2015-06-21 (×2): 1000 ug via ORAL
  Filled 2015-06-19 (×2): qty 1

## 2015-06-19 MED ORDER — FERROUS SULFATE 325 (65 FE) MG PO TABS
325.0000 mg | ORAL_TABLET | ORAL | Status: DC
  Administered 2015-06-20 – 2015-06-21 (×2): 325 mg via ORAL
  Filled 2015-06-19 (×2): qty 1

## 2015-06-19 MED ORDER — PANTOPRAZOLE SODIUM 40 MG PO TBEC
40.0000 mg | DELAYED_RELEASE_TABLET | Freq: Every day | ORAL | Status: DC
  Administered 2015-06-20 – 2015-06-21 (×2): 40 mg via ORAL
  Filled 2015-06-19 (×2): qty 1

## 2015-06-19 MED ORDER — ENOXAPARIN SODIUM 40 MG/0.4ML ~~LOC~~ SOLN
40.0000 mg | SUBCUTANEOUS | Status: DC
  Administered 2015-06-19 – 2015-06-20 (×2): 40 mg via SUBCUTANEOUS
  Filled 2015-06-19 (×2): qty 0.4

## 2015-06-19 MED ORDER — FERROUS SULFATE ER 140 (45 FE) MG PO TBCR: 280.0000 mg | EXTENDED_RELEASE_TABLET | ORAL | Status: DC

## 2015-06-19 NOTE — H&P (Signed)
[facility] NAME: Katie Graves    MR#:  [id number]  DATE OF BIRTH:  [birth year]  DATE OF ADMISSION:  06/19/2015  PRIMARY CARE PHYSICIAN: Tracie Harrier, MD   REQUESTING/REFERRING PHYSICIAN: Eula Listen MD.  CHIEF COMPLAINT:   Chief Complaint  Patient presents with  . Dizziness    HISTORY OF PRESENT ILLNESS: Katie Graves  is a 81 y.o. female with a known history of  hypertension, seasonal allergies and previous history of CVA who has been having issues with nasal congestion and severe allergies. Patient is on Flonase as well as another nasal spray over-the-counter which she is not sure of the name. Patient presents to the emergency room with a few days of dizziness. She initially [facility] and was noted to have systolic blood pressure in the 200s therefore she was brought to the ER. Patient complains of also persistent nasal congestion and drainage. She reports that the dizziness is going on for the past few days. She feels like she is on a pass out. She reports that laying flat makes her worst and dizzy also standing up makes her dizzy as well. Patient in the emergency room was noted to have blood pressure in the 200s PAST MEDICAL HISTORY:   Past Medical History  Diagnosis Date  . Vertigo   . Cancer ([place])   . Stroke [facility])     TIA x3  . Diabetes mellitus without complication ([place])   . Hypertension   . Gastritis   . Seasonal allergies   . Back pain   . Aortic stenosis   . CVA (cerebral infarction)     PAST SURGICAL HISTORY:  Past Surgical History  Procedure Laterality Date  . Colon surgery    . Abdominal hysterectomy    . Back surgery    . Tonsillectomy      SOCIAL HISTORY:  Social History  Substance Use Topics  . Smoking status: Never Smoker   . Smokeless tobacco: Not on file  . Alcohol Use: No    FAMILY HISTORY:  Family History  Problem Relation Age of  Onset  . Hypertension      DRUG ALLERGIES:  Allergies  Allergen Reactions  . Novocain [Procaine]   . Penicillins   . Sulfa Antibiotics     REVIEW OF SYSTEMS:   CONSTITUTIONAL: No fever, fatigue or weakness. Positive dizziness EYES: No blurred or double vision.  EARS, NOSE, AND THROAT: No tinnitus or ear pain.  RESPIRATORY: No cough, shortness of breath, wheezing or hemoptysis.  CARDIOVASCULAR: No chest pain, orthopnea, edema.  GASTROINTESTINAL: No nausea, vomiting, diarrhea or abdominal pain.  GENITOURINARY: No dysuria, hematuria.  ENDOCRINE: No polyuria, nocturia,  HEMATOLOGY: No anemia, easy bruising or bleeding SKIN: No rash or lesion. MUSCULOSKELETAL: No joint pain or arthritis.   NEUROLOGIC: No tingling, numbness, weakness.  PSYCHIATRY: No anxiety or depression.   MEDICATIONS AT HOME:  Prior to Admission medications   Medication Sig Start Date End Date Taking? Authorizing Provider  aspirin EC 81 MG tablet Take 81 mg by mouth every morning.   Yes Historical Provider, MD  atorvastatin (LIPITOR) 10 MG tablet Take 10 mg by mouth 3 (three) times a week. Take on Monday, Wednesday, and Friday. 04/19/14  Yes Historical Provider, MD  cetirizine (ZYRTEC) 10 MG tablet Take 10 mg by mouth daily.   Yes Historical Provider, MD  clopidogrel (PLAVIX) 75 MG tablet Take 75 mg by mouth  every morning. 05/26/15  Yes Historical Provider, MD  Ferrous Sulfate 140 (45 FE) MG TBCR Take 280 mg by mouth every morning.   Yes Historical Provider, MD  fluticasone (FLONASE) 50 MCG/ACT nasal spray Place 2 sprays into both nostrils at bedtime. 05/17/15  Yes Historical Provider, MD  furosemide (LASIX) 40 MG tablet Take 40 mg by mouth daily as needed. For edema 01/26/15 01/26/16 Yes Historical Provider, MD  glimepiride (AMARYL) 2 MG tablet Take 3 mg by mouth daily with breakfast. 06/03/15  Yes Historical Provider, MD  hydrALAZINE (APRESOLINE) 25 MG tablet Take 25 mg by mouth 3 (three) times daily. 06/07/15  Yes  Historical Provider, MD  ipratropium (ATROVENT) 0.03 % nasal spray Place 1 spray into the nose 3 (three) times daily. 05/24/15  Yes Historical Provider, MD  latanoprost (XALATAN) 0.005 % ophthalmic solution Place 1 drop into both eyes at bedtime. 05/05/15  Yes Historical Provider, MD  losartan (COZAAR) 100 MG tablet Take 100 mg by mouth daily. 06/03/15  Yes Historical Provider, MD  omeprazole (PRILOSEC) 20 MG capsule Take 20 mg by mouth 2 (two) times daily. 05/03/15  Yes Historical Provider, MD  verapamil (CALAN-SR) 180 MG CR tablet Take 180 mg by mouth at bedtime. 06/03/15  Yes Historical Provider, MD  vitamin B-12 (CYANOCOBALAMIN) 1000 MCG tablet Take 1,000 mcg by mouth daily. 06/06/15  Yes Historical Provider, MD  Vitamin D, Ergocalciferol, (DRISDOL) 50000 UNITS CAPS capsule Take 50,000 Units by mouth once a week. 05/26/15  Yes Historical Provider, MD      PHYSICAL EXAMINATION:   VITAL SIGNS: Blood pressure 212/77, pulse 70, temperature 97.7 F (36.5 C), temperature source Oral, resp. rate 18, height 5' (1.524 m), weight 72.576 kg (160 lb), SpO2 96 %.  GENERAL:  81 y.o.-year-old patient lying in the bed with no acute distress.  EYES: Pupils equal, round, reactive to light and accommodation. No scleral icterus. Extraocular muscles intact.  HEENT: Head atraumatic, normocephalic. Oropharynx and nasopharynx clear.  NECK:  Supple, no jugular venous distention. No thyroid enlargement, no tenderness.  LUNGS: Normal breath sounds bilaterally, no wheezing, rales,rhonchi or crepitation. No use of accessory muscles of respiration.  CARDIOVASCULAR: S1, S2 normal. Positive systolic murmurs, rubs, or gallops.  ABDOMEN: Soft, nontender, nondistended. Bowel sounds present. No organomegaly or mass.  EXTREMITIES: No pedal edema, cyanosis, or clubbing.  NEUROLOGIC: Cranial nerves II through XII are intact. Muscle strength 5/5 in all extremities. Sensation intact. Gait not checked.  PSYCHIATRIC: The patient is  alert and oriented x 3.  SKIN: No obvious rash, lesion, or ulcer.   LABORATORY PANEL:   CBC  Recent Labs Lab 06/19/15 1410  WBC 7.5  HGB 12.2  HCT 36.8  PLT 200  MCV 89.0  MCH 29.5  MCHC 33.2  RDW 13.5   ------------------------------------------------------------------------------------------------------------------  Chemistries   Recent Labs Lab 06/19/15 1410  NA 139  K 3.5  CL 107  CO2 25  GLUCOSE 147*  BUN 22*  CREATININE 0.97  CALCIUM 8.9   ------------------------------------------------------------------------------------------------------------------ estimated creatinine clearance is 33.6 mL/min (by C-G formula based on Cr of 0.97). ------------------------------------------------------------------------------------------------------------------ No results for input(s): TSH, T4TOTAL, T3FREE, THYROIDAB in the last 72 hours.  Invalid input(s): FREET3   Coagulation profile No results for input(s): INR, PROTIME in the last 168 hours. ------------------------------------------------------------------------------------------------------------------- No results for input(s): DDIMER in the last 72 hours. -------------------------------------------------------------------------------------------------------------------  Cardiac Enzymes  Recent Labs Lab 06/19/15 1410  TROPONINI <0.03   ------------------------------------------------------------------------------------------------------------------ Invalid input(s): POCBNP  ---------------------------------------------------------------------------------------------------------------  Urinalysis    Component Value  Date/Time   COLORURINE STRAW* 06/19/2015 1615   COLORURINE Straw 03/31/2013 1216   APPEARANCEUR CLEAR* 06/19/2015 1615   APPEARANCEUR Clear 03/31/2013 1216   LABSPEC 1.005 06/19/2015 1615   LABSPEC 1.009 03/31/2013 1216   PHURINE 7.0 06/19/2015 1615   PHURINE 6.0 03/31/2013 1216    GLUCOSEU NEGATIVE 06/19/2015 1615   GLUCOSEU Negative 03/31/2013 1216   HGBUR 1+* 06/19/2015 1615   HGBUR 1+ 03/31/2013 1216   BILIRUBINUR NEGATIVE 06/19/2015 1615   BILIRUBINUR Negative 03/31/2013 [place] 06/19/2015 1615   KETONESUR Negative 03/31/2013 1216   PROTEINUR 100* 06/19/2015 1615   PROTEINUR 30 mg/dL 03/31/2013 1216   NITRITE NEGATIVE 06/19/2015 1615   NITRITE Negative 03/31/2013 1216   LEUKOCYTESUR NEGATIVE 06/19/2015 1615   LEUKOCYTESUR Negative 03/31/2013 1216     RADIOLOGY: Ct Angio Head W/cm &/or Wo Cm  06/19/2015   CLINICAL DATA:  Lightheadedness today.  Hypotension.  EXAM: CT ANGIOGRAPHY HEAD AND NECK  TECHNIQUE: Multidetector CT imaging of the head and neck was performed using the standard protocol during bolus administration of intravenous contrast. Multiplanar CT image reconstructions and MIPs were obtained to evaluate the vascular anatomy. Carotid stenosis measurements (when applicable) are obtained utilizing NASCET criteria, using the distal internal carotid diameter as the denominator.  CONTRAST:  69mL OMNIPAQUE IOHEXOL 350 MG/ML SOLN  COMPARISON:  Soft tissue neck CT 12/21/2013. Brain MRI 03/30/2013. Neck CTA 06/14/2011.  FINDINGS: CT HEAD  Brain: Age related cerebral atrophy is again seen. Small, chronic infarcts are present in the left cerebellum and likely bilateral thalami. There is no evidence of acute cortical infarct, intracranial hemorrhage, mass, midline shift, or extra-axial fluid collection.  Calvarium and skull base: No skull fracture or destructive osseous lesion.  Paranasal sinuses: Minimal bilateral maxillary sinus mucosal thickening. Clear mastoid air cells.  Orbits: Prior bilateral cataract extraction.  CTA NECK  Aortic arch: 3 vessel aortic arch with mild-to-moderate calcified plaque. No significant brachiocephalic or right subclavian artery stenosis. There is focal densely calcified plaque in the proximal left subclavian artery  approximately 2 cm beyond its origin, likely resulting in 75%+ stenosis and significantly progressed from the prior CTA.  Right carotid system: Common carotid artery is patent without stenosis. Heavily calcified eccentric plaque located along the posterior wall of the proximal 2 cm of the internal carotid artery results in approximately 65% stenosis, similar to the prior CTA. There is mild irregularity of the more distal cervical ICA which is also unchanged, without stenosis.  Left carotid system: Common carotid artery is patent without stenosis. Densely calcified plaque in the proximal 2 cm of the ICA results in up to 65% stenosis, similar to the prior CTA. Remainder of the cervical ICA is unremarkable.  Vertebral arteries: The vertebral arteries are patent and small bilaterally with the right being mildly dominant. No vertebral artery stenosis is identified.  Skeleton: Mild reversal of the normal cervical lordosis with slight anterolisthesis of C3 on C4 and C4 on C5. Disc degeneration greatest at C5-6 and C6-7. Moderate to severe upper cervical facet arthrosis.  Other neck: Patulous upper thoracic esophagus. Bilateral thyroid nodules measuring up to 1.3 cm in size, similar to the prior neck CT. Right parotid mass on the prior neck CTA appears to have been resected in the interim. 1.2 cm fluid density structure posterior to the right clavicle is unchanged and benign in appearance, possibly lymphatic in etiology.  CTA HEAD  Anterior circulation: Internal carotid arteries are patent from skullbase to carotid termini. There is mild  carotid siphon calcification bilaterally without significant stenosis. ACAs and MCAs are patent without significant stenosis or sizable branch vessel occlusion. No intracranial aneurysm is identified.  Posterior circulation: The intracranial vertebral arteries are patent and small bilaterally, with the left vertebral artery being particularly hypoplastic and functionally terminating in  PICA. Both PICA origins are patent. 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 This appears to be on a developmental basis, as there is a persistent right-sided trigeminal artery, with the basilar artery being larger in caliber distal to this. SCA origins are patent. There are fetal type origins of the PCAs, with both P1 segments being markedly hypoplastic or absent. PCAs are patent without proximal stenosis. There is a punctate calcification involving a distal right PCA branch vessel which is unchanged and may reflect a chronically calcified embolus.  Venous sinuses: No dural venous sinus thrombosis identified. The transverse and sigmoid sinuses are small bilaterally.  Anatomic variants: Persistent right trigeminal artery. Fetal origins of the PCAs.  Delayed phase: No abnormal enhancement.  IMPRESSION: 1. No evidence of acute intracranial abnormality. 2. No medium or large vessel intracranial arterial occlusion or significant proximal stenosis. 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 3. Heavily calcified plaque in both proximal internal carotid arteries resulting in 65% stenosis. 4. 75% or greater stenosis of the proximal left subclavian artery, progressed from 2012.   Electronically Signed   By: Logan Bores M.D.   On: 06/19/2015 17:18   Dg Chest 2 View  06/19/2015   CLINICAL DATA:  81 year old with acute onset of dizziness and presyncope when laying supine which began 3 days ago.  EXAM: CHEST  2 VIEW  COMPARISON:  06/05/2011 and earlier.  FINDINGS: Cardiac silhouette mildly enlarged, unchanged. Mitral annular calcification. Thoracic aorta atherosclerotic, unchanged. Hilar and mediastinal contours otherwise unremarkable. Mildly prominent bronchovascular markings diffusely and mild central peribronchial thickening, unchanged. No new pulmonary parenchymal abnormalities. No  pleural effusions. Interval compression fracture of the upper endplate of T9 on the order of 40% or so.  IMPRESSION: 1. Stable mild cardiomegaly. Stable mild changes of chronic bronchitis and/or asthma. No acute cardiopulmonary disease. 2. Compression fracture of the upper endplate of T9, new since 2012 but not likely acute.   Electronically Signed   By: Evangeline Dakin M.D.   On: 06/19/2015 14:51   Ct Angio Neck W/cm &/or Wo/cm  06/19/2015   CLINICAL DATA:  Lightheadedness today.  Hypotension.  EXAM: CT ANGIOGRAPHY HEAD AND NECK  TECHNIQUE: Multidetector CT imaging of the head and neck was performed using the standard protocol during bolus administration of intravenous contrast. Multiplanar CT image reconstructions and MIPs were obtained to evaluate the vascular anatomy. Carotid stenosis measurements (when applicable) are obtained utilizing NASCET criteria, using the distal internal carotid diameter as the denominator.  CONTRAST:  52mL OMNIPAQUE IOHEXOL 350 MG/ML SOLN  COMPARISON:  Soft tissue neck CT 12/21/2013. Brain MRI 03/30/2013. Neck CTA 06/14/2011.  FINDINGS: CT HEAD  Brain: Age related cerebral atrophy is again seen. Small, chronic infarcts are present in the left cerebellum and likely bilateral thalami. There is no evidence of acute cortical infarct, intracranial hemorrhage, mass, midline shift, or extra-axial fluid collection.  Calvarium and skull base: No skull fracture or destructive osseous lesion.  Paranasal sinuses: Minimal bilateral maxillary sinus mucosal thickening. Clear mastoid air cells.  Orbits: Prior bilateral cataract extraction.  CTA NECK  Aortic arch: 3 vessel aortic arch  with mild-to-moderate calcified plaque. No significant brachiocephalic or right subclavian artery stenosis. There is focal densely calcified plaque in the proximal left subclavian artery approximately 2 cm beyond its origin, likely resulting in 75%+ stenosis and significantly progressed from the prior CTA.  Right  carotid system: Common carotid artery is patent without stenosis. Heavily calcified eccentric plaque located along the posterior wall of the proximal 2 cm of the internal carotid artery results in approximately 65% stenosis, similar to the prior CTA. There is mild irregularity of the more distal cervical ICA which is also unchanged, without stenosis.  Left carotid system: Common carotid artery is patent without stenosis. Densely calcified plaque in the proximal 2 cm of the ICA results in up to 65% stenosis, similar to the prior CTA. Remainder of the cervical ICA is unremarkable.  Vertebral arteries: The vertebral arteries are patent and small bilaterally with the right being mildly dominant. No vertebral artery stenosis is identified.  Skeleton: Mild reversal of the normal cervical lordosis with slight anterolisthesis of C3 on C4 and C4 on C5. Disc degeneration greatest at C5-6 and C6-7. Moderate to severe upper cervical facet arthrosis.  Other neck: Patulous upper thoracic esophagus. Bilateral thyroid nodules measuring up to 1.3 cm in size, similar to the prior neck CT. Right parotid mass on the prior neck CTA appears to have been resected in the interim. 1.2 cm fluid density structure posterior to the right clavicle is unchanged and benign in appearance, possibly lymphatic in etiology.  CTA HEAD  Anterior circulation: Internal carotid arteries are patent from skullbase to carotid termini. There is mild carotid siphon calcification bilaterally without significant stenosis. ACAs and MCAs are patent without significant stenosis or sizable branch vessel occlusion. No intracranial aneurysm is identified.  Posterior circulation: The intracranial vertebral arteries are patent and small bilaterally, with the left vertebral artery being particularly hypoplastic and functionally terminating in PICA. Both PICA origins are patent. 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 This appears to be on a  developmental basis, as there is a persistent right-sided trigeminal artery, with the basilar artery being larger in caliber distal to this. SCA origins are patent. There are fetal type origins of the PCAs, with both P1 segments being markedly hypoplastic or absent. PCAs are patent without proximal stenosis. There is a punctate calcification involving a distal right PCA branch vessel which is unchanged and may reflect a chronically calcified embolus.  Venous sinuses: No dural venous sinus thrombosis identified. The transverse and sigmoid sinuses are small bilaterally.  Anatomic variants: Persistent right trigeminal artery. Fetal origins of the PCAs.  Delayed phase: No abnormal enhancement.  IMPRESSION: 1. No evidence of acute intracranial abnormality. 2. No medium or large vessel intracranial arterial occlusion or significant proximal stenosis. 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 3. Heavily calcified plaque in both proximal internal carotid arteries resulting in 65% stenosis. 4. 75% or greater stenosis of the proximal left subclavian artery, progressed from 2012.   Electronically Signed   By: Logan Bores M.D.   On: 06/19/2015 17:18    EKG: Orders placed or performed during the hospital encounter of 06/19/15  . EKG 12-Lead  . EKG 12-Lead  . ED EKG  . ED EKG    IMPRESSION AND PLAN: Patient is a 81 year old white female presents with dizziness noted to have accelerated hypertension  1. Accelerated hypertension I will go ahead and increase  her hydralazine from 25-50 mg every 8 hours. We'll also do when necessary clonidine. Continue her verapamil and losartan as taking at home. Further adjustment of her blood pressure medications based on her blood pressure, also need to make sure the patient is not using inhaler and not taking containing Sudafed (her son will be bringing the inhaler  from home)  2. Dizziness certainly could be related to her blood pressure, also history of aortic stenosis hasn't had a echo checked in few years I will go ahead and check echocardiogram of the heart  3. Diabetes continue glimepiride will place her on sliding scale insulin  4. Seasonal allergies continue Claritin and Flonase patient states that she is not using Flonase on daily basis I recommend that this is a daily medication that she should be using  5. Peripheral vascular disease based on the CTA she is followed outpatient by vascular continue aspirin and Plavix    All the records are reviewed and case discussed with ED provider. Management plans discussed with the patient, family and they are in agreement.  CODE STATUS: Full    Code Status Orders        Start     Ordered   06/19/15 1842  Full code   Continuous     06/19/15 1842       TOTAL TIME TAKING CARE OF THIS PATIENT: 55 minutes.    Dustin Flock M.D on 06/19/2015 at 7:27 PM  Between 7am to 6pm - Pager - [phone]  After 6pm go to [email or web address] - password [email or web address] [facility]  Office  [phone]  CC: Primary care physician; Tracie Harrier, MD

## 2015-06-19 NOTE — Progress Notes (Signed)
Pt complaining of neck and shoulder pain.  MD, Dr. Lavetta Nielsen notified.  MD to place order for tylenol.  Jessee Avers

## 2015-06-19 NOTE — ED Provider Notes (Signed)
[facility] Emergency Department Provider Note  ____________________________________________  Time seen: Approximately 3:42 PM  I have reviewed the triage vital signs and the nursing notes.   HISTORY  Chief Complaint Dizziness    HPI Katie Graves is a 81 y.o. 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 Patient reports multiple episodes of acute onset lightheadedness. The first episode occurred 4 days ago while undergoing a procedure for her vascular surgeon. She reports that she laid down and immediately had the onset of severe lightheadedness "like I was going to pass out." This was not associated with chest pain, palpitations, headache, visual changes, changes in speech, nausea or vomiting. It lasted 1-2 minutes and resolved when she was able to sit up. Since then, each time that she lays down she becomes symptomatic. No recent illness including cough or cold symptoms, fever, chills, nausea or vomiting, diarrhea; no recent changes in her medications.   Past Medical History  Diagnosis Date  . Vertigo   . Cancer ([place])   . Stroke [facility])     TIA x3  . Diabetes mellitus without complication ([place])   . Hypertension   . Gastritis   . Seasonal allergies   . Back pain   . Aortic stenosis   . CVA (cerebral infarction)     Patient Active Problem List   Diagnosis Date Noted  . Accelerated hypertension 06/19/2015    Past Surgical History  Procedure Laterality Date  . Colon surgery    . Abdominal hysterectomy    . Back surgery    . Tonsillectomy      Current Outpatient Rx  Name  Route  Sig  Dispense  Refill  . aspirin EC 81 MG tablet   Oral   Take 81 mg by mouth every morning.         Marland Kitchen atorvastatin (LIPITOR) 10 MG tablet   Oral   Take 10 mg by mouth 3 (three) times a week. Take on Monday, Wednesday, and Friday.         . clopidogrel (PLAVIX) 75 MG tablet    Oral   Take 75 mg by mouth every morning.         . Ferrous Sulfate 140 (45 FE) MG TBCR   Oral   Take 280 mg by mouth every morning.         . fluticasone (FLONASE) 50 MCG/ACT nasal spray   Each Nare   Place 2 sprays into both nostrils at bedtime.         . furosemide (LASIX) 40 MG tablet   Oral   Take 40 mg by mouth daily as needed. For edema         . glimepiride (AMARYL) 2 MG tablet   Oral   Take 3 mg by mouth daily with breakfast.         . hydrALAZINE (APRESOLINE) 25 MG tablet   Oral   Take 25 mg by mouth 3 (three) times daily.         Marland Kitchen ipratropium (ATROVENT) 0.03 % nasal spray   Nasal   Place 1 spray into the nose 3 (three) times daily.         Marland Kitchen latanoprost (XALATAN) 0.005 % ophthalmic solution   Both Eyes   Place 1 drop into both eyes at bedtime.         Marland Kitchen losartan (COZAAR) 100 MG tablet   Oral   Take  100 mg by mouth daily.         Marland Kitchen omeprazole (PRILOSEC) 20 MG capsule   Oral   Take 20 mg by mouth 2 (two) times daily.         . Travoprost, BAK Free, (TRAVATAN Z) 0.004 % SOLN ophthalmic solution   Ophthalmic   Apply 1 drop to eye at bedtime.         . verapamil (CALAN-SR) 180 MG CR tablet   Oral   Take 180 mg by mouth at bedtime.         . vitamin B-12 (CYANOCOBALAMIN) 1000 MCG tablet   Oral   Take 1,000 mcg by mouth daily.         . Vitamin D, Ergocalciferol, (DRISDOL) 50000 UNITS CAPS capsule   Oral   Take 50,000 Units by mouth once a week.           Allergies Novocain; Penicillins; and Sulfa antibiotics  Family History  Problem Relation Age of Onset  . Hypertension      Social History Social History  Substance Use Topics  . Smoking status: Never Smoker   . Smokeless tobacco: None  . Alcohol Use: No    Review of Systems Constitutional: No fever/chills. No syncope. Eyes: No visual changes. ENT: No sore throat. Cardiovascular: Denies chest pain, palpitations. Respiratory: Denies shortness of breath.  No  cough. Gastrointestinal: No abdominal pain.  No nausea, no vomiting.  No diarrhea.  No constipation. Genitourinary: Negative for dysuria. Musculoskeletal: Negative for back pain. Skin: Negative for rash. Neurological: Negative for headaches, focal weakness or numbness. Positive for severe lightheadedness.  10-point ROS otherwise negative.  ____________________________________________   PHYSICAL EXAM:  VITAL SIGNS: ED Triage Vitals  Enc Vitals Group     BP 06/19/15 1406 170/72 mmHg     Pulse Rate 06/19/15 1406 71     Resp 06/19/15 1406 16     Temp 06/19/15 1406 97.7 F (36.5 C)     Temp Source 06/19/15 1406 Oral     SpO2 06/19/15 1406 94 %     Weight 06/19/15 1406 160 lb (72.576 kg)     Height 06/19/15 1406 5' (1.524 m)     Head Cir --      Peak Flow --      Pain Score 06/19/15 1407 4     Pain Loc --      Pain Edu? --      Excl. in [place]? --     Constitutional: Alert and oriented. Well appearing and in no acute distress. Answer question appropriately. Eyes: Conjunctivae are normal.  EOMI. Head: Atraumatic. Nose: No congestion/rhinnorhea. Mouth/Throat: Mucous membranes are moist.  Neck: No stridor.  Supple. No JVD. Full range of motion without pain.  Cardiovascular: Normal rate, regular rhythm. 5/6 holosystolic murmur with no rubs or gallops.  Respiratory: Normal respiratory effort.  No retractions. Lungs CTAB.  No wheezes, rales or ronchi. Gastrointestinal: Soft and nontender. No distention. No peritoneal signs. Musculoskeletal: No LE edema. No calf tenderness or palpable cords. No Homans sign. Neurologic: Alert and oriented 3. Speech is clear. Face and smile symmetric. EOMI and PERRLA. No nystagmus. Lateral gaze does not reproduce symptoms. Tongue is midline. No pronator drift. 5 out of 5 grip, biceps, triceps, hip flexors, plantar flexion and dorsiflexion. Normal sensation to light touch in the bilateral upper and lower extremities, and face. Patient is significantly  symptomatic when I lay her flat, reporting severe lightheadedness and inability to tolerate laying flat. Skin:  Skin is warm, dry and intact. No rash noted. Psychiatric: Mood and affect are normal. Speech and behavior are normal.  Normal judgement.  ____________________________________________   LABS (all labs ordered are listed, but only abnormal results are displayed)  Labs Reviewed  BASIC METABOLIC PANEL - Abnormal; Notable for the following:    Glucose, Bld 147 (*)    BUN 22 (*)    GFR calc non Af Amer 50 (*)    GFR calc Af Amer 57 (*)    All other components within normal limits  URINALYSIS COMPLETEWITH MICROSCOPIC ([facility] ONLY) - Abnormal; Notable for the following:    Color, Urine STRAW (*)    APPearance CLEAR (*)    Hgb urine dipstick 1+ (*)    Protein, ur 100 (*)    All other components within normal limits  CBC  TROPONIN I  TSH  CBC  BASIC METABOLIC PANEL   ____________________________________________  EKG  ED ECG REPORT I, Eula Listen, the attending physician, personally viewed and interpreted this ECG.   Date: 06/19/2015  EKG Time: 1410  Rate: 70  Rhythm: normal sinus rhythm  Axis: Leftward  Intervals:none  ST&T Change: No ST elevation. No ischemic changes. Positive for  PVC  ____________________________________________  RADIOLOGY  Ct Angio Head W/cm &/or Wo Cm  06/19/2015   CLINICAL DATA:  Lightheadedness today.  Hypotension.  EXAM: CT ANGIOGRAPHY HEAD AND NECK  TECHNIQUE: Multidetector CT imaging of the head and neck was performed using the standard protocol during bolus administration of intravenous contrast. Multiplanar CT image reconstructions and MIPs were obtained to evaluate the vascular anatomy. Carotid stenosis measurements (when applicable) are obtained utilizing NASCET criteria, using the distal internal carotid diameter as the denominator.  CONTRAST:  32mL OMNIPAQUE IOHEXOL 350 MG/ML SOLN  COMPARISON:  Soft tissue neck CT 12/21/2013.  Brain MRI 03/30/2013. Neck CTA 06/14/2011.  FINDINGS: CT HEAD  Brain: Age related cerebral atrophy is again seen. Small, chronic infarcts are present in the left cerebellum and likely bilateral thalami. There is no evidence of acute cortical infarct, intracranial hemorrhage, mass, midline shift, or extra-axial fluid collection.  Calvarium and skull base: No skull fracture or destructive osseous lesion.  Paranasal sinuses: Minimal bilateral maxillary sinus mucosal thickening. Clear mastoid air cells.  Orbits: Prior bilateral cataract extraction.  CTA NECK  Aortic arch: 3 vessel aortic arch with mild-to-moderate calcified plaque. No significant brachiocephalic or right subclavian artery stenosis. There is focal densely calcified plaque in the proximal left subclavian artery approximately 2 cm beyond its origin, likely resulting in 75%+ stenosis and significantly progressed from the prior CTA.  Right carotid system: Common carotid artery is patent without stenosis. Heavily calcified eccentric plaque located along the posterior wall of the proximal 2 cm of the internal carotid artery results in approximately 65% stenosis, similar to the prior CTA. There is mild irregularity of the more distal cervical ICA which is also unchanged, without stenosis.  Left carotid system: Common carotid artery is patent without stenosis. Densely calcified plaque in the proximal 2 cm of the ICA results in up to 65% stenosis, similar to the prior CTA. Remainder of the cervical ICA is unremarkable.  Vertebral arteries: The vertebral arteries are patent and small bilaterally with the right being mildly dominant. No vertebral artery stenosis is identified.  Skeleton: Mild reversal of the normal cervical lordosis with slight anterolisthesis of C3 on C4 and C4 on C5. Disc degeneration greatest at C5-6 and C6-7. Moderate to severe upper cervical facet arthrosis.  Other neck:  Patulous upper thoracic esophagus. Bilateral thyroid nodules measuring up  to 1.3 cm in size, similar to the prior neck CT. Right parotid mass on the prior neck CTA appears to have been resected in the interim. 1.2 cm fluid density structure posterior to the right clavicle is unchanged and benign in appearance, possibly lymphatic in etiology.  CTA HEAD  Anterior circulation: Internal carotid arteries are patent from skullbase to carotid termini. There is mild carotid siphon calcification bilaterally without significant stenosis. ACAs and MCAs are patent without significant stenosis or sizable branch vessel occlusion. No intracranial aneurysm is identified.  Posterior circulation: The intracranial vertebral arteries are patent and small bilaterally, with the left vertebral artery being particularly hypoplastic and functionally terminating in PICA. Both PICA origins are patent. 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 This appears to be on a developmental basis, as there is a persistent right-sided trigeminal artery, with the basilar artery being larger in caliber distal to this. SCA origins are patent. There are fetal type origins of the PCAs, with both P1 segments being markedly hypoplastic or absent. PCAs are patent without proximal stenosis. There is a punctate calcification involving a distal right PCA branch vessel which is unchanged and may reflect a chronically calcified embolus.  Venous sinuses: No dural venous sinus thrombosis identified. The transverse and sigmoid sinuses are small bilaterally.  Anatomic variants: Persistent right trigeminal artery. Fetal origins of the PCAs.  Delayed phase: No abnormal enhancement.  IMPRESSION: 1. No evidence of acute intracranial abnormality. 2. No medium or large vessel intracranial arterial occlusion or significant proximal stenosis. 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 3. Heavily calcified plaque  in both proximal internal carotid arteries resulting in 65% stenosis. 4. 75% or greater stenosis of the proximal left subclavian artery, progressed from 2012.   Electronically Signed   By: Logan Bores M.D.   On: 06/19/2015 17:18   Dg Chest 2 View  06/19/2015   CLINICAL DATA:  81 year old with acute onset of dizziness and presyncope when laying supine which began 3 days ago.  EXAM: CHEST  2 VIEW  COMPARISON:  06/05/2011 and earlier.  FINDINGS: Cardiac silhouette mildly enlarged, unchanged. Mitral annular calcification. Thoracic aorta atherosclerotic, unchanged. Hilar and mediastinal contours otherwise unremarkable. Mildly prominent bronchovascular markings diffusely and mild central peribronchial thickening, unchanged. No new pulmonary parenchymal abnormalities. No pleural effusions. Interval compression fracture of the upper endplate of T9 on the order of 40% or so.  IMPRESSION: 1. Stable mild cardiomegaly. Stable mild changes of chronic bronchitis and/or asthma. No acute cardiopulmonary disease. 2. Compression fracture of the upper endplate of T9, new since 2012 but not likely acute.   Electronically Signed   By: Evangeline Dakin M.D.   On: 06/19/2015 14:51   Ct Angio Neck W/cm &/or Wo/cm  06/19/2015   CLINICAL DATA:  Lightheadedness today.  Hypotension.  EXAM: CT ANGIOGRAPHY HEAD AND NECK  TECHNIQUE: Multidetector CT imaging of the head and neck was performed using the standard protocol during bolus administration of intravenous contrast. Multiplanar CT image reconstructions and MIPs were obtained to evaluate the vascular anatomy. Carotid stenosis measurements (when applicable) are obtained utilizing NASCET criteria, using the distal internal carotid diameter as the denominator.  CONTRAST:  40mL OMNIPAQUE IOHEXOL 350 MG/ML SOLN  COMPARISON:  Soft tissue neck CT 12/21/2013. Brain MRI 03/30/2013. Neck CTA 06/14/2011.  FINDINGS: CT HEAD  Brain: Age related cerebral  atrophy is again seen. Small, chronic  infarcts are present in the left cerebellum and likely bilateral thalami. There is no evidence of acute cortical infarct, intracranial hemorrhage, mass, midline shift, or extra-axial fluid collection.  Calvarium and skull base: No skull fracture or destructive osseous lesion.  Paranasal sinuses: Minimal bilateral maxillary sinus mucosal thickening. Clear mastoid air cells.  Orbits: Prior bilateral cataract extraction.  CTA NECK  Aortic arch: 3 vessel aortic arch with mild-to-moderate calcified plaque. No significant brachiocephalic or right subclavian artery stenosis. There is focal densely calcified plaque in the proximal left subclavian artery approximately 2 cm beyond its origin, likely resulting in 75%+ stenosis and significantly progressed from the prior CTA.  Right carotid system: Common carotid artery is patent without stenosis. Heavily calcified eccentric plaque located along the posterior wall of the proximal 2 cm of the internal carotid artery results in approximately 65% stenosis, similar to the prior CTA. There is mild irregularity of the more distal cervical ICA which is also unchanged, without stenosis.  Left carotid system: Common carotid artery is patent without stenosis. Densely calcified plaque in the proximal 2 cm of the ICA results in up to 65% stenosis, similar to the prior CTA. Remainder of the cervical ICA is unremarkable.  Vertebral arteries: The vertebral arteries are patent and small bilaterally with the right being mildly dominant. No vertebral artery stenosis is identified.  Skeleton: Mild reversal of the normal cervical lordosis with slight anterolisthesis of C3 on C4 and C4 on C5. Disc degeneration greatest at C5-6 and C6-7. Moderate to severe upper cervical facet arthrosis.  Other neck: Patulous upper thoracic esophagus. Bilateral thyroid nodules measuring up to 1.3 cm in size, similar to the prior neck CT. Right parotid mass on the prior neck CTA appears to have been resected in the  interim. 1.2 cm fluid density structure posterior to the right clavicle is unchanged and benign in appearance, possibly lymphatic in etiology.  CTA HEAD  Anterior circulation: Internal carotid arteries are patent from skullbase to carotid termini. There is mild carotid siphon calcification bilaterally without significant stenosis. ACAs and MCAs are patent without significant stenosis or sizable branch vessel occlusion. No intracranial aneurysm is identified.  Posterior circulation: The intracranial vertebral arteries are patent and small bilaterally, with the left vertebral artery being particularly hypoplastic and functionally terminating in PICA. Both PICA origins are patent. 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 This appears to be on a developmental basis, as there is a persistent right-sided trigeminal artery, with the basilar artery being larger in caliber distal to this. SCA origins are patent. There are fetal type origins of the PCAs, with both P1 segments being markedly hypoplastic or absent. PCAs are patent without proximal stenosis. There is a punctate calcification involving a distal right PCA branch vessel which is unchanged and may reflect a chronically calcified embolus.  Venous sinuses: No dural venous sinus thrombosis identified. The transverse and sigmoid sinuses are small bilaterally.  Anatomic variants: Persistent right trigeminal artery. Fetal origins of the PCAs.  Delayed phase: No abnormal enhancement.  IMPRESSION: 1. No evidence of acute intracranial abnormality. 2. No medium or large vessel intracranial arterial occlusion or significant proximal stenosis. 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 3. Heavily calcified plaque in both proximal internal carotid arteries resulting in 65% stenosis. 4. 75% or greater stenosis of the proximal left subclavian  artery, progressed from 2012.  Electronically Signed   By: Logan Bores M.D.   On: 06/19/2015 17:18    ____________________________________________   PROCEDURES  Procedure(s) performed: None  Critical Care performed: No ____________________________________________   INITIAL IMPRESSION / ASSESSMENT AND PLAN / ED COURSE  Pertinent labs & imaging results that were available during my care of the patient were reviewed by me and considered in my medical decision making (see chart for details).  81 y.o. female with reported known basilar artery occlusion resenting with severe lightheadedness that is positional only. She has no other neurologic symptoms or findings on my exam. I am concerned that she is having cerebral perfusion insufficiency when she lays down, which could be related to her basilar artery or potentially have a cardiovascular cause given the significant finding of heart murmur on exam. We will get a CT, 4/CT angiogram of the head and neck. Patient will require admission. At this time, the patient is significantly hypertensive with a blood pressure in the 220s over 70s; I will give her a small dose of metoprolol that do not wish to drop her pressure too much if she is requiring her cerebral perfusion.  ----------------------------------------- 5:06 PM on 06/19/2015 -----------------------------------------  The patient has reassuring laboratory studies. She has a mild hyperglycemia but it is unlikely that this is the cause of her lightheadedness. She does not appear to have a urinary tract infection or any other electrolyte abnormalities. Her chest x-ray does not show any acute process. I'm waiting for the CT of her head and neck to return for final disposition.  7:02 PM Patient blood pressure is grossly unchanged after metoprolol. She continues to be asymptomatic when she sits up. I am awaiting her CT results for her admission.  ----------------------------------------- 6:07  PM on 06/19/2015 -----------------------------------------  Patient CT shows; IMPRESSION: 1. No evidence of acute intracranial abnormality. 2. No medium or large vessel intracranial arterial occlusion or significant proximal stenosis. 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 3. Heavily calcified plaque in both proximal internal carotid arteries resulting in 65% stenosis. 4. 75% or greater stenosis of the proximal left subclavian artery, progressed from 2012.  At this time, the patient is stable and I'll plan her admission. ____________________________________________  FINAL CLINICAL IMPRESSION(S) / ED DIAGNOSES  Final diagnoses:  Lightheadedness  Essential hypertension  Subclavian arterial stenosis (HCC)  Carotid stenosis, bilateral  Basilar artery narrowing      NEW MEDICATIONS STARTED DURING THIS VISIT:  New Prescriptions   No medications on Graves     Eula Listen, MD 06/19/15 1902

## 2015-06-19 NOTE — ED Notes (Signed)
Attempted to call report to floor.  Nurse not available at this time and will return call for report.

## 2015-06-19 NOTE — ED Notes (Signed)
Pt sent over from [facility]; reports dizziness and hypotension since Thursday. Pt reports she was at Dr Bunnie Domino office on Thursday to have her arteries checked every 6 months.

## 2015-06-19 NOTE — ED Notes (Signed)
Dr. Nadara Mustard notified of patient's blood pressure and of prn medications given.  Additional dose of 10 mg hydralazine ordered and administered per MAR.  Ok to send to floor.

## 2015-06-20 ENCOUNTER — Observation Stay
Admit: 2015-06-20 | Discharge: 2015-06-20 | Disposition: A | Discharge status: Home / Self Care | Payer: Medicare Other | Attending: Internal Medicine | Admitting: Internal Medicine

## 2015-06-20 ENCOUNTER — Observation Stay: Admit: 2015-06-20 | Payer: Medicare Other

## 2015-06-20 DIAGNOSIS — M25511 Pain in right shoulder: Secondary | ICD-10-CM | POA: Diagnosis not present

## 2015-06-20 DIAGNOSIS — M25512 Pain in left shoulder: Secondary | ICD-10-CM | POA: Diagnosis not present

## 2015-06-20 DIAGNOSIS — Z9842 Cataract extraction status, left eye: Secondary | ICD-10-CM | POA: Diagnosis not present

## 2015-06-20 DIAGNOSIS — E042 Nontoxic multinodular goiter: Secondary | ICD-10-CM | POA: Diagnosis not present

## 2015-06-20 DIAGNOSIS — I771 Stricture of artery: Secondary | ICD-10-CM | POA: Diagnosis not present

## 2015-06-20 DIAGNOSIS — Z8249 Family history of ischemic heart disease and other diseases of the circulatory system: Secondary | ICD-10-CM | POA: Diagnosis not present

## 2015-06-20 DIAGNOSIS — Z794 Long term (current) use of insulin: Secondary | ICD-10-CM | POA: Diagnosis not present

## 2015-06-20 DIAGNOSIS — Z90710 Acquired absence of both cervix and uterus: Secondary | ICD-10-CM | POA: Diagnosis not present

## 2015-06-20 DIAGNOSIS — Z8673 Personal history of transient ischemic attack (TIA), and cerebral infarction without residual deficits: Secondary | ICD-10-CM | POA: Diagnosis not present

## 2015-06-20 DIAGNOSIS — E119 Type 2 diabetes mellitus without complications: Secondary | ICD-10-CM | POA: Diagnosis not present

## 2015-06-20 DIAGNOSIS — I739 Peripheral vascular disease, unspecified: Secondary | ICD-10-CM | POA: Diagnosis not present

## 2015-06-20 DIAGNOSIS — J42 Unspecified chronic bronchitis: Secondary | ICD-10-CM | POA: Diagnosis not present

## 2015-06-20 DIAGNOSIS — Z880 Allergy status to penicillin: Secondary | ICD-10-CM | POA: Diagnosis not present

## 2015-06-20 DIAGNOSIS — I38 Endocarditis, valve unspecified: Secondary | ICD-10-CM | POA: Diagnosis not present

## 2015-06-20 DIAGNOSIS — R42 Dizziness and giddiness: Secondary | ICD-10-CM | POA: Diagnosis present

## 2015-06-20 DIAGNOSIS — I959 Hypotension, unspecified: Secondary | ICD-10-CM | POA: Diagnosis not present

## 2015-06-20 DIAGNOSIS — Z884 Allergy status to anesthetic agent status: Secondary | ICD-10-CM | POA: Diagnosis not present

## 2015-06-20 DIAGNOSIS — I6523 Occlusion and stenosis of bilateral carotid arteries: Secondary | ICD-10-CM | POA: Diagnosis present

## 2015-06-20 DIAGNOSIS — F419 Anxiety disorder, unspecified: Secondary | ICD-10-CM | POA: Diagnosis not present

## 2015-06-20 DIAGNOSIS — M50322 Other cervical disc degeneration at C5-C6 level: Secondary | ICD-10-CM | POA: Diagnosis not present

## 2015-06-20 DIAGNOSIS — Z7902 Long term (current) use of antithrombotics/antiplatelets: Secondary | ICD-10-CM | POA: Diagnosis not present

## 2015-06-20 DIAGNOSIS — M50323 Other cervical disc degeneration at C6-C7 level: Secondary | ICD-10-CM | POA: Diagnosis not present

## 2015-06-20 DIAGNOSIS — Z882 Allergy status to sulfonamides status: Secondary | ICD-10-CM | POA: Diagnosis not present

## 2015-06-20 DIAGNOSIS — Z7982 Long term (current) use of aspirin: Secondary | ICD-10-CM | POA: Diagnosis not present

## 2015-06-20 DIAGNOSIS — R55 Syncope and collapse: Secondary | ICD-10-CM | POA: Diagnosis not present

## 2015-06-20 DIAGNOSIS — Z23 Encounter for immunization: Secondary | ICD-10-CM | POA: Diagnosis not present

## 2015-06-20 DIAGNOSIS — M4802 Spinal stenosis, cervical region: Secondary | ICD-10-CM | POA: Diagnosis not present

## 2015-06-20 DIAGNOSIS — I651 Occlusion and stenosis of basilar artery: Secondary | ICD-10-CM | POA: Diagnosis present

## 2015-06-20 DIAGNOSIS — R918 Other nonspecific abnormal finding of lung field: Secondary | ICD-10-CM | POA: Diagnosis not present

## 2015-06-20 DIAGNOSIS — I350 Nonrheumatic aortic (valve) stenosis: Secondary | ICD-10-CM | POA: Diagnosis not present

## 2015-06-20 DIAGNOSIS — Z9841 Cataract extraction status, right eye: Secondary | ICD-10-CM | POA: Diagnosis not present

## 2015-06-20 DIAGNOSIS — Z79899 Other long term (current) drug therapy: Secondary | ICD-10-CM | POA: Diagnosis not present

## 2015-06-20 DIAGNOSIS — I10 Essential (primary) hypertension: Secondary | ICD-10-CM | POA: Diagnosis not present

## 2015-06-20 LAB — BASIC METABOLIC PANEL
Anion gap: 5 (ref 5–15)
BUN: 23 mg/dL — AB (ref 6–20)
CALCIUM: 8.7 mg/dL — AB (ref 8.9–10.3)
CHLORIDE: 111 mmol/L (ref 101–111)
CO2: 26 mmol/L (ref 22–32)
CREATININE: 0.96 mg/dL (ref 0.44–1.00)
GFR calc non Af Amer: 50 mL/min — ABNORMAL LOW (ref >60)
GFR, EST AFRICAN AMERICAN: 58 mL/min — AB (ref >60)
GLUCOSE: 125 mg/dL — AB (ref 65–99)
Potassium: 3.4 mmol/L — ABNORMAL LOW (ref 3.5–5.1)
Sodium: 142 mmol/L (ref 135–145)

## 2015-06-20 LAB — CBC
HEMATOCRIT: 31.2 % — AB (ref 35.0–47.0)
Hemoglobin: 10.6 g/dL — ABNORMAL LOW (ref 12.0–16.0)
MCH: 30 pg (ref 26.0–34.0)
MCHC: 33.9 g/dL (ref 32.0–36.0)
MCV: 88.7 fL (ref 80.0–100.0)
Platelets: 179 10*3/uL (ref 150–440)
RBC: 3.52 MIL/uL — ABNORMAL LOW (ref 3.80–5.20)
RDW: 13.5 % (ref 11.5–14.5)
WBC: 7.3 10*3/uL (ref 3.6–11.0)

## 2015-06-20 MED ORDER — INFLUENZA VAC SPLIT QUAD 0.5 ML IM SUSY
0.5000 mL | PREFILLED_SYRINGE | INTRAMUSCULAR | Status: AC
  Administered 2015-06-21: 0.5 mL via INTRAMUSCULAR
  Filled 2015-06-20: qty 0.5

## 2015-06-20 MED ORDER — ALPRAZOLAM 0.25 MG PO TABS
0.2500 mg | ORAL_TABLET | Freq: Every day | ORAL | Status: DC | PRN
  Administered 2015-06-20: 0.25 mg via ORAL
  Filled 2015-06-20: qty 1

## 2015-06-20 MED ORDER — FEXOFENADINE HCL 180 MG PO TABS
180.0000 mg | ORAL_TABLET | Freq: Every day | ORAL | Status: DC
  Administered 2015-06-20 – 2015-06-21 (×2): 180 mg via ORAL
  Filled 2015-06-20 (×2): qty 1

## 2015-06-20 MED ORDER — CYCLOBENZAPRINE HCL 10 MG PO TABS
5.0000 mg | ORAL_TABLET | Freq: Two times a day (BID) | ORAL | Status: DC | PRN
  Administered 2015-06-20: 5 mg via ORAL
  Filled 2015-06-20: qty 1

## 2015-06-20 NOTE — Progress Notes (Addendum)
Dr. Ginette Pitman - paged to make aware of high bp- waiting on callback   MD aware- no addition orders - continue to monitor

## 2015-06-20 NOTE — Progress Notes (Signed)
BP remains high in right arm 203/59....BP 149/70 in left arm- Dr. Ginette Pitman paged to make aware- no new orders given - instructed no need to treat at this time- poss due to blockage in neck- pt comfortable no c/o pain/  continue to monitor

## 2015-06-20 NOTE — Progress Notes (Signed)
*  PRELIMINARY RESULTS* Echocardiogram 2D Echocardiogram has been performed.  Katie Graves 06/20/2015, 3:17 PM

## 2015-06-20 NOTE — Progress Notes (Signed)
PROGRESS NOTE  Katie Graves KXF:[id number] DOB: 27-Jun-1922 DOA: 06/19/2015 PCP: Tracie Harrier, MD  Subjective 81 y/o f with hx of HTN,Seasonal allergies and previous CVA admitted with accelerated HTN. Pt felt light headed and feels like she was "going to pass out" This am c/o Pain in posterior neck area  and shoulder pain     Objective: BP 182/56 mmHg  Pulse 62  Temp(Src) 97.7 F (36.5 C) (Oral)  Resp 22  Ht 5' (1.524 m)  Wt 72.576 kg (160 lb)  BMI 31.25 kg/m2  SpO2 97%  Intake/Output Summary (Last 24 hours) at 06/20/15 1254 Last data filed at 06/20/15 1100  Gross per 24 hour  Intake    360 ml  Output    800 ml  Net   -440 ml   Filed Weights   06/19/15 1406  Weight: 72.576 kg (160 lb)    Exam:  General: Anxious elderly  female HEENT: PERRL; Congested posterior pharynx Neck: supple, trachea midline, no thyromegaly Chest: normal to palpation Lungs: clear bilaterally without retractions or wheezes Cardiovascular: RRR, no murmur, no gallop; distal pulses 2+ Abdomen: soft, nontender, nondistended, positive bowel sounds Extremities: no clubbing, cyanosis, edema Neuro: alert and oriented, moves all extremities Derm: no significant rashes or nodules; good skin turgor Lymph: no cervical or supraclavicular lymphadenopathy   Labs and imaging studies were reviewed*  Data Reviewed: Basic Metabolic Panel:  Recent Labs Lab 06/19/15 1410 06/20/15 0337  NA 139 142  K 3.5 3.4*  CL 107 111  CO2 25 26  GLUCOSE 147* 125*  BUN 22* 23*  CREATININE 0.97 0.96  CALCIUM 8.9 8.7*   Liver Function Tests: No results for input(s): AST, ALT, ALKPHOS, BILITOT, PROT, ALBUMIN in the last 168 hours. No results for input(s): LIPASE, AMYLASE in the last 168 hours. No results for input(s): AMMONIA in the last 168 hours. CBC:  Recent Labs Lab 06/19/15 1410 06/20/15 0337  WBC 7.5 7.3  HGB 12.2 10.6*  HCT 36.8 31.2*  MCV 89.0 88.7  PLT 200 179   Cardiac Enzymes:     Recent Labs Lab 06/19/15 1410  TROPONINI <0.03   BNP (last 3 results) No results for input(s): BNP in the last 8760 hours.  ProBNP (last 3 results) No results for input(s): PROBNP in the last 8760 hours.  CBG: No results for input(s): GLUCAP in the last 168 hours.  No results found for this or any previous visit (from the past 240 hour(s)).   Studies: Ct Angio Head W/cm &/or Wo Cm  06/19/2015   CLINICAL DATA:  Lightheadedness today.  Hypotension.  EXAM: CT ANGIOGRAPHY HEAD AND NECK  TECHNIQUE: Multidetector CT imaging of the head and neck was performed using the standard protocol during bolus administration of intravenous contrast. Multiplanar CT image reconstructions and MIPs were obtained to evaluate the vascular anatomy. Carotid stenosis measurements (when applicable) are obtained utilizing NASCET criteria, using the distal internal carotid diameter as the denominator.  CONTRAST:  39mL OMNIPAQUE IOHEXOL 350 MG/ML SOLN  COMPARISON:  Soft tissue neck CT 12/21/2013. Brain MRI 03/30/2013. Neck CTA 06/14/2011.  FINDINGS: CT HEAD  Brain: Age related cerebral atrophy is again seen. Small, chronic infarcts are present in the left cerebellum and likely bilateral thalami. There is no evidence of acute cortical infarct, intracranial hemorrhage, mass, midline shift, or extra-axial fluid collection.  Calvarium and skull base: No skull fracture or destructive osseous lesion.  Paranasal sinuses: Minimal bilateral maxillary sinus mucosal thickening. Clear mastoid air cells.  Orbits: Prior bilateral  cataract extraction.  CTA NECK  Aortic arch: 3 vessel aortic arch with mild-to-moderate calcified plaque. No significant brachiocephalic or right subclavian artery stenosis. There is focal densely calcified plaque in the proximal left subclavian artery approximately 2 cm beyond its origin, likely resulting in 75%+ stenosis and significantly progressed from the prior CTA.  Right carotid system: Common carotid  artery is patent without stenosis. Heavily calcified eccentric plaque located along the posterior wall of the proximal 2 cm of the internal carotid artery results in approximately 65% stenosis, similar to the prior CTA. There is mild irregularity of the more distal cervical ICA which is also unchanged, without stenosis.  Left carotid system: Common carotid artery is patent without stenosis. Densely calcified plaque in the proximal 2 cm of the ICA results in up to 65% stenosis, similar to the prior CTA. Remainder of the cervical ICA is unremarkable.  Vertebral arteries: The vertebral arteries are patent and small bilaterally with the right being mildly dominant. No vertebral artery stenosis is identified.  Skeleton: Mild reversal of the normal cervical lordosis with slight anterolisthesis of C3 on C4 and C4 on C5. Disc degeneration greatest at C5-6 and C6-7. Moderate to severe upper cervical facet arthrosis.  Other neck: Patulous upper thoracic esophagus. Bilateral thyroid nodules measuring up to 1.3 cm in size, similar to the prior neck CT. Right parotid mass on the prior neck CTA appears to have been resected in the interim. 1.2 cm fluid density structure posterior to the right clavicle is unchanged and benign in appearance, possibly lymphatic in etiology.  CTA HEAD  Anterior circulation: Internal carotid arteries are patent from skullbase to carotid termini. There is mild carotid siphon calcification bilaterally without significant stenosis. ACAs and MCAs are patent without significant stenosis or sizable branch vessel occlusion. No intracranial aneurysm is identified.  Posterior circulation: The intracranial vertebral arteries are patent and small bilaterally, with the left vertebral artery being particularly hypoplastic and functionally terminating in PICA. Both PICA origins are patent. 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 This appears to be on a developmental basis, as there is a  persistent right-sided trigeminal artery, with the basilar artery being larger in caliber distal to this. SCA origins are patent. There are fetal type origins of the PCAs, with both P1 segments being markedly hypoplastic or absent. PCAs are patent without proximal stenosis. There is a punctate calcification involving a distal right PCA branch vessel which is unchanged and may reflect a chronically calcified embolus.  Venous sinuses: No dural venous sinus thrombosis identified. The transverse and sigmoid sinuses are small bilaterally.  Anatomic variants: Persistent right trigeminal artery. Fetal origins of the PCAs.  Delayed phase: No abnormal enhancement.  IMPRESSION: 1. No evidence of acute intracranial abnormality. 2. No medium or large vessel intracranial arterial occlusion or significant proximal stenosis. 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 3. Heavily calcified plaque in both proximal internal carotid arteries resulting in 65% stenosis. 4. 75% or greater stenosis of the proximal left subclavian artery, progressed from 2012.   Electronically Signed   By: Logan Bores M.D.   On: 06/19/2015 17:18   Dg Chest 2 View  06/19/2015   CLINICAL DATA:  81 year old with acute onset of dizziness and presyncope when laying supine which began 3 days ago.  EXAM: CHEST  2 VIEW  COMPARISON:  06/05/2011 and earlier.  FINDINGS: Cardiac silhouette mildly enlarged, unchanged.  Mitral annular calcification. Thoracic aorta atherosclerotic, unchanged. Hilar and mediastinal contours otherwise unremarkable. Mildly prominent bronchovascular markings diffusely and mild central peribronchial thickening, unchanged. No new pulmonary parenchymal abnormalities. No pleural effusions. Interval compression fracture of the upper endplate of T9 on the order of 40% or so.  IMPRESSION: 1. Stable mild cardiomegaly. Stable mild  changes of chronic bronchitis and/or asthma. No acute cardiopulmonary disease. 2. Compression fracture of the upper endplate of T9, new since 2012 but not likely acute.   Electronically Signed   By: Evangeline Dakin M.D.   On: 06/19/2015 14:51   Ct Angio Neck W/cm &/or Wo/cm  06/19/2015   CLINICAL DATA:  Lightheadedness today.  Hypotension.  EXAM: CT ANGIOGRAPHY HEAD AND NECK  TECHNIQUE: Multidetector CT imaging of the head and neck was performed using the standard protocol during bolus administration of intravenous contrast. Multiplanar CT image reconstructions and MIPs were obtained to evaluate the vascular anatomy. Carotid stenosis measurements (when applicable) are obtained utilizing NASCET criteria, using the distal internal carotid diameter as the denominator.  CONTRAST:  70mL OMNIPAQUE IOHEXOL 350 MG/ML SOLN  COMPARISON:  Soft tissue neck CT 12/21/2013. Brain MRI 03/30/2013. Neck CTA 06/14/2011.  FINDINGS: CT HEAD  Brain: Age related cerebral atrophy is again seen. Small, chronic infarcts are present in the left cerebellum and likely bilateral thalami. There is no evidence of acute cortical infarct, intracranial hemorrhage, mass, midline shift, or extra-axial fluid collection.  Calvarium and skull base: No skull fracture or destructive osseous lesion.  Paranasal sinuses: Minimal bilateral maxillary sinus mucosal thickening. Clear mastoid air cells.  Orbits: Prior bilateral cataract extraction.  CTA NECK  Aortic arch: 3 vessel aortic arch with mild-to-moderate calcified plaque. No significant brachiocephalic or right subclavian artery stenosis. There is focal densely calcified plaque in the proximal left subclavian artery approximately 2 cm beyond its origin, likely resulting in 75%+ stenosis and significantly progressed from the prior CTA.  Right carotid system: Common carotid artery is patent without stenosis. Heavily calcified eccentric plaque located along the posterior wall of the proximal 2 cm of the  internal carotid artery results in approximately 65% stenosis, similar to the prior CTA. There is mild irregularity of the more distal cervical ICA which is also unchanged, without stenosis.  Left carotid system: Common carotid artery is patent without stenosis. Densely calcified plaque in the proximal 2 cm of the ICA results in up to 65% stenosis, similar to the prior CTA. Remainder of the cervical ICA is unremarkable.  Vertebral arteries: The vertebral arteries are patent and small bilaterally with the right being mildly dominant. No vertebral artery stenosis is identified.  Skeleton: Mild reversal of the normal cervical lordosis with slight anterolisthesis of C3 on C4 and C4 on C5. Disc degeneration greatest at C5-6 and C6-7. Moderate to severe upper cervical facet arthrosis.  Other neck: Patulous upper thoracic esophagus. Bilateral thyroid nodules measuring up to 1.3 cm in size, similar to the prior neck CT. Right parotid mass on the prior neck CTA appears to have been resected in the interim. 1.2 cm fluid density structure posterior to the right clavicle is unchanged and benign in appearance, possibly lymphatic in etiology.  CTA HEAD  Anterior circulation: Internal carotid arteries are patent from skullbase to carotid termini. There is mild carotid siphon calcification bilaterally without significant stenosis. ACAs and MCAs are patent without significant stenosis or sizable branch vessel occlusion. No intracranial aneurysm is identified.  Posterior circulation: The intracranial vertebral arteries are patent and small bilaterally, with the left  vertebral artery being particularly hypoplastic and functionally terminating in PICA. Both PICA origins are patent. 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 This appears to be on a developmental basis, as there is a persistent right-sided trigeminal artery, with the basilar artery being larger in caliber distal to this. SCA origins are patent. There  are fetal type origins of the PCAs, with both P1 segments being markedly hypoplastic or absent. PCAs are patent without proximal stenosis. There is a punctate calcification involving a distal right PCA branch vessel which is unchanged and may reflect a chronically calcified embolus.  Venous sinuses: No dural venous sinus thrombosis identified. The transverse and sigmoid sinuses are small bilaterally.  Anatomic variants: Persistent right trigeminal artery. Fetal origins of the PCAs.  Delayed phase: No abnormal enhancement.  IMPRESSION: 1. No evidence of acute intracranial abnormality. 2. No medium or large vessel intracranial arterial occlusion or significant proximal stenosis. 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 3. Heavily calcified plaque in both proximal internal carotid arteries resulting in 65% stenosis. 4. 75% or greater stenosis of the proximal left subclavian artery, progressed from 2012.   Electronically Signed   By: Logan Bores M.D.   On: 06/19/2015 17:18    Scheduled Meds: . aspirin EC  81 mg Oral BH-q7a  . atorvastatin  10 mg Oral Once per day on Mon Wed Fri  . clopidogrel  75 mg Oral BH-q7a  . enoxaparin (LOVENOX) injection  40 mg Subcutaneous Q24H  . ferrous sulfate  325 mg Oral BH-q7a  . fluticasone  2 spray Each Nare QHS  . glimepiride  3 mg Oral Q breakfast  . hydrALAZINE  50 mg Oral 3 times per day  . ipratropium  1 spray Nasal TID  . latanoprost  1 drop Both Eyes QHS  . loratadine  10 mg Oral Daily  . losartan  100 mg Oral Daily  . pantoprazole  40 mg Oral Daily  . sodium chloride  3 mL Intravenous Q12H  . verapamil  180 mg Oral QHS  . vitamin B-12  1,000 mcg Oral Daily  . [START ON 06/24/2015] Vitamin D (Ergocalciferol)  50,000 Units Oral Weekly    Continuous Infusions:   Assessment/Plan:  1  Accelerated hypertension:On Hydralazine,Metoprolol,  Clonidine,Verapamil and Losartan 2 Dizziness/ Aortic stenosis: Awaiting results of ECHO 3 Carotid stenosis; Recent Carotid angio- 65 % stenosis of bilat ICA and 75% stenosis of  Proximal left Subclavian ; On Plavix  4 Type 2 DM: On Glimepiride 5 Neck and shoulder pain; Trial of low dose Cyclobenzaprine   Code Status: Full  Family Communication: Daughter        06/20/2015, 12:54 PM

## 2015-06-21 DIAGNOSIS — I10 Essential (primary) hypertension: Secondary | ICD-10-CM | POA: Diagnosis not present

## 2015-06-21 LAB — CBC WITH DIFFERENTIAL/PLATELET
BASOS PCT: 2 %
Basophils Absolute: 0.1 10*3/uL (ref 0–0.1)
EOS ABS: 0.4 10*3/uL (ref 0–0.7)
Eosinophils Relative: 6 %
HEMATOCRIT: 32.2 % — AB (ref 35.0–47.0)
HEMOGLOBIN: 11 g/dL — AB (ref 12.0–16.0)
LYMPHS ABS: 1.6 10*3/uL (ref 1.0–3.6)
Lymphocytes Relative: 25 %
MCH: 30.4 pg (ref 26.0–34.0)
MCHC: 34.2 g/dL (ref 32.0–36.0)
MCV: 88.7 fL (ref 80.0–100.0)
MONO ABS: 0.6 10*3/uL (ref 0.2–0.9)
MONOS PCT: 9 %
NEUTROS ABS: 3.8 10*3/uL (ref 1.4–6.5)
NEUTROS PCT: 58 %
Platelets: 170 10*3/uL (ref 150–440)
RBC: 3.63 MIL/uL — ABNORMAL LOW (ref 3.80–5.20)
RDW: 13.5 % (ref 11.5–14.5)
WBC: 6.5 10*3/uL (ref 3.6–11.0)

## 2015-06-21 LAB — BASIC METABOLIC PANEL
Anion gap: 7 (ref 5–15)
BUN: 23 mg/dL — ABNORMAL HIGH (ref 6–20)
CALCIUM: 8.6 mg/dL — AB (ref 8.9–10.3)
CHLORIDE: 105 mmol/L (ref 101–111)
CO2: 25 mmol/L (ref 22–32)
CREATININE: 0.97 mg/dL (ref 0.44–1.00)
GFR calc Af Amer: 57 mL/min — ABNORMAL LOW (ref >60)
GFR calc non Af Amer: 50 mL/min — ABNORMAL LOW (ref >60)
GLUCOSE: 156 mg/dL — AB (ref 65–99)
Potassium: 3.2 mmol/L — ABNORMAL LOW (ref 3.5–5.1)
Sodium: 137 mmol/L (ref 135–145)

## 2015-06-21 LAB — GLUCOSE, CAPILLARY: Glucose-Capillary: 227 mg/dL — ABNORMAL HIGH (ref 65–99)

## 2015-06-21 MED ORDER — CLONIDINE HCL 0.1 MG PO TABS: 0.1000 mg | ORAL_TABLET | Freq: Four times a day (QID) | ORAL | Status: DC | PRN

## 2015-06-21 MED ORDER — ALPRAZOLAM 0.25 MG PO TABS: 0.2500 mg | ORAL_TABLET | Freq: Every day | ORAL | Status: DC | PRN

## 2015-06-21 MED ORDER — FEXOFENADINE HCL 180 MG PO TABS: 180.0000 mg | ORAL_TABLET | Freq: Every day | ORAL | Status: DC

## 2015-06-21 MED ORDER — CYCLOBENZAPRINE HCL 5 MG PO TABS: 5.0000 mg | ORAL_TABLET | Freq: Two times a day (BID) | ORAL | Status: DC | PRN

## 2015-06-21 NOTE — Progress Notes (Signed)
Katie Graves to be D/C'd Home per MD order.  Discussed with the patient and all questions fully answered.  VSS, Skin clean, dry and intact without evidence of skin break down, no evidence of skin tears noted. IV catheter discontinued intact. Site without signs and symptoms of complications. Dressing and pressure applied.  An After Visit Summary was printed and given to the patient. Patient received prescription.  D/c education completed with patient/family including follow up instructions, medication list, d/c activities limitations if indicated, with other d/c instructions as indicated by MD - patient able to verbalize understanding, all questions fully answered.   Patient instructed to return to ED, call 911, or call MD for any changes in condition.   Patient escorted via [place], and D/C home via private auto. Patient tolerated walking in the hall well. Patient did not complain of dizziness or shortness of breath.   Horton Finer 06/21/2015 2:25 PM

## 2015-06-21 NOTE — Progress Notes (Signed)
PROGRESS NOTE  CARIGAN LISTER BSJ:[id number] DOB: 1922/04/14 DOA: 06/19/2015 PCP: Tracie Harrier, MD  Subjective 81 y/o f with hx of HTN,Seasonal allergies and previous CVA admitted with accelerated HTN. Pt felt light headed and feels like she was "going to pass out" This am feels better. Neck pain has resolved     Objective: BP 137/66 mmHg  Pulse 58  Temp(Src) 98 F (36.7 C) (Oral)  Resp 17  Ht 5' (1.524 m)  Wt 72.576 kg (160 lb)  BMI 31.25 kg/m2  SpO2 98%  Intake/Output Summary (Last 24 hours) at 06/21/15 1254 Last data filed at 06/21/15 1059  Gross per 24 hour  Intake    366 ml  Output   1000 ml  Net   -634 ml   Filed Weights   06/19/15 1406  Weight: 72.576 kg (160 lb)    Exam:  General: Anxious elderly  female HEENT: PERRL; Congested posterior pharynx Neck: supple, trachea midline, no thyromegaly Chest: normal to palpation Lungs: clear bilaterally without retractions or wheezes Cardiovascular: RRR, no murmur, no gallop; distal pulses 2+ Abdomen: soft, nontender, nondistended, positive bowel sounds Extremities: no clubbing, cyanosis, edema Neuro: alert and oriented, moves all extremities Derm: no significant rashes or nodules; good skin turgor Lymph: no cervical or supraclavicular lymphadenopathy   Labs and imaging studies were reviewed*  Data Reviewed: Basic Metabolic Panel:  Recent Labs Lab 06/19/15 1410 06/20/15 0337 06/20/15 2342  NA 139 142 137  K 3.5 3.4* 3.2*  CL 107 111 105  CO2 25 26 25   GLUCOSE 147* 125* 156*  BUN 22* 23* 23*  CREATININE 0.97 0.96 0.97  CALCIUM 8.9 8.7* 8.6*   Liver Function Tests: No results for input(s): AST, ALT, ALKPHOS, BILITOT, PROT, ALBUMIN in the last 168 hours. No results for input(s): LIPASE, AMYLASE in the last 168 hours. No results for input(s): AMMONIA in the last 168 hours. CBC:  Recent Labs Lab 06/19/15 1410 06/20/15 0337 06/21/15 0036  WBC 7.5 7.3 6.5  NEUTROABS  --   --  3.8  HGB 12.2  10.6* 11.0*  HCT 36.8 31.2* 32.2*  MCV 89.0 88.7 88.7  PLT 200 179 170   Cardiac Enzymes:    Recent Labs Lab 06/19/15 1410  TROPONINI <0.03   BNP (last 3 results) No results for input(s): BNP in the last 8760 hours.  ProBNP (last 3 results) No results for input(s): PROBNP in the last 8760 hours.  CBG:  Recent Labs Lab 06/19/15 2128  GLUCAP 227*    No results found for this or any previous visit (from the past 240 hour(s)).   Studies: No results found.  Scheduled Meds: . aspirin EC  81 mg Oral BH-q7a  . atorvastatin  10 mg Oral Once per day on Mon Wed Fri  . clopidogrel  75 mg Oral BH-q7a  . enoxaparin (LOVENOX) injection  40 mg Subcutaneous Q24H  . ferrous sulfate  325 mg Oral BH-q7a  . fexofenadine  180 mg Oral Daily  . fluticasone  2 spray Each Nare QHS  . glimepiride  3 mg Oral Q breakfast  . hydrALAZINE  50 mg Oral 3 times per day  . ipratropium  1 spray Nasal TID  . latanoprost  1 drop Both Eyes QHS  . losartan  100 mg Oral Daily  . pantoprazole  40 mg Oral Daily  . sodium chloride  3 mL Intravenous Q12H  . verapamil  180 mg Oral QHS  . vitamin B-12  1,000 mcg Oral Daily  . [  START ON 06/24/2015] Vitamin D (Ergocalciferol)  50,000 Units Oral Weekly    Continuous Infusions:   Assessment/Plan:  1  Accelerated hypertension:On Hydralazine,Metoprolol, Clonidine,Verapamil and Losartan 2 Dizziness/ Aortic stenosis: ECHO; EF 65 to 70%  3 Carotid stenosis; Recent Carotid angio- 65 % stenosis of bilat ICA and 75% stenosis of  Proximal left Subclavian ; On Plavix  4 Type 2 DM: On Glimepiride 5 Neck and shoulder pain; Better- On  Cyclobenzaprine Ambulate today. D/c home later today   Code Status: Full  Family Communication: Daughter        06/21/2015, 12:54 PM

## 2015-06-24 NOTE — Discharge Summary (Signed)
Physician Discharge Summary  Katie Graves ZDG:[id number] DOB: February 09, 1922 DOA: 06/19/2015  PCP: Tracie Harrier, MD  Admit date: 06/19/2015 Discharge date: 06/21/2015  Time spent: 30 minutes  Recommendations for Outpatient Follow-up:  D/c Home today. Call office to make appt in 1-2 weeks   Discharge Diagnoses:   1  Accelerated hypertension 2 Valvular heart disease 3 Type 2 DM 4 Anxiety  Discharge Condition: Stable  Diet recommendation: 2 gms sodium  Filed Weights   06/19/15 1406  Weight: 72.576 kg (160 lb)    History of present illness:  Katie Graves is a 81 year old female with a history of hypertension, seasonal allergies previous CVA, Presenting the ED complaining of dizziness. Patient had gone to the walk-in clinic initially and his systolic blood pressure was in the 200 range and she was subsequently brought to the emergency room  Hospital Course:  Patient was admitted [facility] and her  hydralazine dose was increased from 25 mg to 50 g every 8 hours she was also started on when necessary clonidine patient was continued on her verapamil as often. During his stay in hospital patient complaining of pain in the back of the neck and shoulders for which she received muscle relaxant and also Xanax. Patient's anxiety levels gradually subsided and her blood pressure also came down to manageable levels. She declined MRI for neck Echocardiogram showed EF of 65-70% .CT scan of the head with angiogram did not show any evidence of acute intracranial abnormality . There  was heavily calcified plaque  in both proximal ICAs resulting 65% percent stenosis there was also a 75% stenosis of the proximal left subclavian artery no medium or large intracranial arterial occlusion of significant proximal stenosis was seen  Pt was stable at time of discharge. Her blood pressure was stable and she was allowed home she was advised follow me Dr. Ginette Pitman in 1-2  weeks   Discharge Exam: Filed Vitals:   06/21/15 1131  BP: 137/66  Pulse: 58  Temp: 98 F (36.7 C)  Resp: 17    General: Not in distress Cardiovascular: S1 S2 3/6 SEM + Respiratory: Clear to auscultation  Discharge Instructions   Discharge Instructions    Diet - low sodium heart healthy    Complete by:  As directed      Discharge instructions    Complete by:  As directed   D/c Home today. Call office to make appt in 1week          Discharge Medication List as of 06/21/2015  1:53 PM    START taking these medications   Details  ALPRAZolam (XANAX) 0.25 MG tablet Take 1 tablet (0.25 mg total) by mouth daily as needed for anxiety., Starting 06/21/2015, Until Discontinued, Print    cloNIDine (CATAPRES) 0.1 MG tablet Take 1 tablet (0.1 mg total) by mouth every 6 (six) hours as needed (SBP>170)., Starting 06/21/2015, Until Discontinued, Normal    cyclobenzaprine (FLEXERIL) 5 MG tablet Take 1 tablet (5 mg total) by mouth 2 (two) times daily as needed for muscle spasms., Starting 06/21/2015, Until Discontinued, Normal    fexofenadine (ALLEGRA) 180 MG tablet Take 1 tablet (180 mg total) by mouth daily., Starting 06/21/2015, Until Discontinued, Normal      CONTINUE these medications which have NOT CHANGED   Details  aspirin EC 81 MG tablet Take 81 mg by mouth every morning., Until Discontinued, Historical Med    atorvastatin (LIPITOR) 10 MG tablet Take 10 mg by mouth 3 (three) times a week. Take  on Monday, Wednesday, and Friday., Starting 04/19/2014, Until Discontinued, Historical Med    clopidogrel (PLAVIX) 75 MG tablet Take 75 mg by mouth every morning., Starting 05/26/2015, Until Discontinued, Historical Med    Ferrous Sulfate 140 (45 FE) MG TBCR Take 280 mg by mouth every morning., Until Discontinued, Historical Med    fluticasone (FLONASE) 50 MCG/ACT nasal spray Place 2 sprays into both nostrils at bedtime., Starting 05/17/2015, Until Discontinued, Historical Med     furosemide (LASIX) 40 MG tablet Take 40 mg by mouth daily as needed. For edema, Starting 01/26/2015, Until Thu 01/26/16, Historical Med    glimepiride (AMARYL) 2 MG tablet Take 3 mg by mouth daily with breakfast., Starting 06/03/2015, Until Discontinued, Historical Med    hydrALAZINE (APRESOLINE) 25 MG tablet Take 25 mg by mouth 3 (three) times daily., Starting 06/07/2015, Until Discontinued, Historical Med    ipratropium (ATROVENT) 0.03 % nasal spray Place 1 spray into the nose 3 (three) times daily., Starting 05/24/2015, Until Discontinued, Historical Med    latanoprost (XALATAN) 0.005 % ophthalmic solution Place 1 drop into both eyes at bedtime., Starting 05/05/2015, Until Discontinued, Historical Med    losartan (COZAAR) 100 MG tablet Take 100 mg by mouth daily., Starting 06/03/2015, Until Discontinued, Historical Med    omeprazole (PRILOSEC) 20 MG capsule Take 20 mg by mouth 2 (two) times daily., Starting 05/03/2015, Until Discontinued, Historical Med    verapamil (CALAN-SR) 180 MG CR tablet Take 180 mg by mouth at bedtime., Starting 06/03/2015, Until Discontinued, Historical Med    vitamin B-12 (CYANOCOBALAMIN) 1000 MCG tablet Take 1,000 mcg by mouth daily., Starting 06/06/2015, Until Discontinued, Historical Med    Vitamin D, Ergocalciferol, (DRISDOL) 50000 UNITS CAPS capsule Take 50,000 Units by mouth once a week., Starting 05/26/2015, Until Discontinued, Historical Med      STOP taking these medications     cetirizine (ZYRTEC) 10 MG tablet        Allergies  Allergen Reactions  . Novocain [Procaine]   . Penicillins   . Sulfa Antibiotics       The results of significant diagnostics from this hospitalization (including imaging, microbiology, ancillary and laboratory) are listed below for reference.    Significant Diagnostic Studies: Ct Angio Head W/cm &/or Wo Cm  06/19/2015  CLINICAL DATA:  Lightheadedness today.  Hypotension. EXAM: CT ANGIOGRAPHY HEAD AND NECK TECHNIQUE:  Multidetector CT imaging of the head and neck was performed using the standard protocol during bolus administration of intravenous contrast. Multiplanar CT image reconstructions and MIPs were obtained to evaluate the vascular anatomy. Carotid stenosis measurements (when applicable) are obtained utilizing NASCET criteria, using the distal internal carotid diameter as the denominator. CONTRAST:  53mL OMNIPAQUE IOHEXOL 350 MG/ML SOLN COMPARISON:  Soft tissue neck CT 12/21/2013. Brain MRI 03/30/2013. Neck CTA 06/14/2011. FINDINGS: CT HEAD Brain: Age related cerebral atrophy is again seen. Small, chronic infarcts are present in the left cerebellum and likely bilateral thalami. There is no evidence of acute cortical infarct, intracranial hemorrhage, mass, midline shift, or extra-axial fluid collection. Calvarium and skull base: No skull fracture or destructive osseous lesion. Paranasal sinuses: Minimal bilateral maxillary sinus mucosal thickening. Clear mastoid air cells. Orbits: Prior bilateral cataract extraction. CTA NECK Aortic arch: 3 vessel aortic arch with mild-to-moderate calcified plaque. No significant brachiocephalic or right subclavian artery stenosis. There is focal densely calcified plaque in the proximal left subclavian artery approximately 2 cm beyond its origin, likely resulting in 75%+ stenosis and significantly progressed from the prior CTA. Right carotid system: Common  carotid artery is patent without stenosis. Heavily calcified eccentric plaque located along the posterior wall of the proximal 2 cm of the internal carotid artery results in approximately 65% stenosis, similar to the prior CTA. There is mild irregularity of the more distal cervical ICA which is also unchanged, without stenosis. Left carotid system: Common carotid artery is patent without stenosis. Densely calcified plaque in the proximal 2 cm of the ICA results in up to 65% stenosis, similar to the prior CTA. Remainder of the cervical  ICA is unremarkable. Vertebral arteries: The vertebral arteries are patent and small bilaterally with the right being mildly dominant. No vertebral artery stenosis is identified. Skeleton: Mild reversal of the normal cervical lordosis with slight anterolisthesis of C3 on C4 and C4 on C5. Disc degeneration greatest at C5-6 and C6-7. Moderate to severe upper cervical facet arthrosis. Other neck: Patulous upper thoracic esophagus. Bilateral thyroid nodules measuring up to 1.3 cm in size, similar to the prior neck CT. Right parotid mass on the prior neck CTA appears to have been resected in the interim. 1.2 cm fluid density structure posterior to the right clavicle is unchanged and benign in appearance, possibly lymphatic in etiology. CTA HEAD Anterior circulation: Internal carotid arteries are patent from skullbase to carotid termini. There is mild carotid siphon calcification bilaterally without significant stenosis. ACAs and MCAs are patent without significant stenosis or sizable branch vessel occlusion. No intracranial aneurysm is identified. Posterior circulation: The intracranial vertebral arteries are patent and small bilaterally, with the left vertebral artery being particularly hypoplastic and functionally terminating in PICA. Both PICA origins are patent. 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 This appears to be on a developmental basis, as there is a persistent right-sided trigeminal artery, with the basilar artery being larger in caliber distal to this. SCA origins are patent. There are fetal type origins of the PCAs, with both P1 segments being markedly hypoplastic or absent. PCAs are patent without proximal stenosis. There is a punctate calcification involving a distal right PCA branch vessel which is unchanged and may reflect a chronically calcified embolus. Venous sinuses: No dural venous sinus thrombosis identified. The transverse and sigmoid sinuses are small bilaterally.  Anatomic variants: Persistent right trigeminal artery. Fetal origins of the PCAs. Delayed phase: No abnormal enhancement. IMPRESSION: 1. No evidence of acute intracranial abnormality. 2. No medium or large vessel intracranial arterial occlusion or significant proximal stenosis. 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 3. Heavily calcified plaque in both proximal internal carotid arteries resulting in 65% stenosis. 4. 75% or greater stenosis of the proximal left subclavian artery, progressed from 2012. Electronically Signed   By: Logan Bores M.D.   On: 06/19/2015 17:18   Dg Chest 2 View  06/19/2015  CLINICAL DATA:  81 year old with acute onset of dizziness and presyncope when laying supine which began 3 days ago. EXAM: CHEST  2 VIEW COMPARISON:  06/05/2011 and earlier. FINDINGS: Cardiac silhouette mildly enlarged, unchanged. Mitral annular calcification. Thoracic aorta atherosclerotic, unchanged. Hilar and mediastinal contours otherwise unremarkable. Mildly prominent bronchovascular markings diffusely and mild central peribronchial thickening, unchanged. No new pulmonary parenchymal abnormalities. No pleural effusions. Interval compression fracture of the upper endplate of T9 on the order of 40% or so. IMPRESSION: 1. Stable mild cardiomegaly. Stable mild changes of chronic bronchitis and/or asthma. No acute cardiopulmonary disease. 2. Compression fracture of the upper endplate of T9, new since  2012 but not likely acute. Electronically Signed   By: Evangeline Dakin M.D.   On: 06/19/2015 14:51   Ct Angio Neck W/cm &/or Wo/cm  06/19/2015  CLINICAL DATA:  Lightheadedness today.  Hypotension. EXAM: CT ANGIOGRAPHY HEAD AND NECK TECHNIQUE: Multidetector CT imaging of the head and neck was performed using the standard protocol during bolus administration of intravenous contrast. Multiplanar CT image  reconstructions and MIPs were obtained to evaluate the vascular anatomy. Carotid stenosis measurements (when applicable) are obtained utilizing NASCET criteria, using the distal internal carotid diameter as the denominator. CONTRAST:  24mL OMNIPAQUE IOHEXOL 350 MG/ML SOLN COMPARISON:  Soft tissue neck CT 12/21/2013. Brain MRI 03/30/2013. Neck CTA 06/14/2011. FINDINGS: CT HEAD Brain: Age related cerebral atrophy is again seen. Small, chronic infarcts are present in the left cerebellum and likely bilateral thalami. There is no evidence of acute cortical infarct, intracranial hemorrhage, mass, midline shift, or extra-axial fluid collection. Calvarium and skull base: No skull fracture or destructive osseous lesion. Paranasal sinuses: Minimal bilateral maxillary sinus mucosal thickening. Clear mastoid air cells. Orbits: Prior bilateral cataract extraction. CTA NECK Aortic arch: 3 vessel aortic arch with mild-to-moderate calcified plaque. No significant brachiocephalic or right subclavian artery stenosis. There is focal densely calcified plaque in the proximal left subclavian artery approximately 2 cm beyond its origin, likely resulting in 75%+ stenosis and significantly progressed from the prior CTA. Right carotid system: Common carotid artery is patent without stenosis. Heavily calcified eccentric plaque located along the posterior wall of the proximal 2 cm of the internal carotid artery results in approximately 65% stenosis, similar to the prior CTA. There is mild irregularity of the more distal cervical ICA which is also unchanged, without stenosis. Left carotid system: Common carotid artery is patent without stenosis. Densely calcified plaque in the proximal 2 cm of the ICA results in up to 65% stenosis, similar to the prior CTA. Remainder of the cervical ICA is unremarkable. Vertebral arteries: The vertebral arteries are patent and small bilaterally with the right being mildly dominant. No vertebral artery stenosis  is identified. Skeleton: Mild reversal of the normal cervical lordosis with slight anterolisthesis of C3 on C4 and C4 on C5. Disc degeneration greatest at C5-6 and C6-7. Moderate to severe upper cervical facet arthrosis. Other neck: Patulous upper thoracic esophagus. Bilateral thyroid nodules measuring up to 1.3 cm in size, similar to the prior neck CT. Right parotid mass on the prior neck CTA appears to have been resected in the interim. 1.2 cm fluid density structure posterior to the right clavicle is unchanged and benign in appearance, possibly lymphatic in etiology. CTA HEAD Anterior circulation: Internal carotid arteries are patent from skullbase to carotid termini. There is mild carotid siphon calcification bilaterally without significant stenosis. ACAs and MCAs are patent without significant stenosis or sizable branch vessel occlusion. No intracranial aneurysm is identified. Posterior circulation: The intracranial vertebral arteries are patent and small bilaterally, with the left vertebral artery being particularly hypoplastic and functionally terminating in PICA. Both PICA origins are patent. 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 This appears to be on a developmental basis, as there is a persistent right-sided trigeminal artery, with the basilar artery being larger in caliber distal to this. SCA origins are patent. There are fetal type origins of the PCAs, with both P1 segments being markedly hypoplastic or absent. PCAs are patent without proximal stenosis. There is a punctate calcification involving a distal right PCA branch vessel which is unchanged and may reflect a chronically calcified embolus.  Venous sinuses: No dural venous sinus thrombosis identified. The transverse and sigmoid sinuses are small bilaterally. Anatomic variants: Persistent right trigeminal artery. Fetal origins of the PCAs. Delayed phase: No abnormal enhancement. IMPRESSION: 1. No evidence of acute  intracranial abnormality. 2. No medium or large vessel intracranial arterial occlusion or significant proximal stenosis. 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 3. Heavily calcified plaque in both proximal internal carotid arteries resulting in 65% stenosis. 4. 75% or greater stenosis of the proximal left subclavian artery, progressed from 2012. Electronically Signed   By: Logan Bores M.D.   On: 06/19/2015 17:18    Microbiology: No results found for this or any previous visit (from the past 240 hour(s)).   Labs: Basic Metabolic Panel:  Recent Labs Lab 06/19/15 1410 06/20/15 0337 06/20/15 2342  NA 139 142 137  K 3.5 3.4* 3.2*  CL 107 111 105  CO2 25 26 25   GLUCOSE 147* 125* 156*  BUN 22* 23* 23*  CREATININE 0.97 0.96 0.97  CALCIUM 8.9 8.7* 8.6*   Liver Function Tests: No results for input(s): AST, ALT, ALKPHOS, BILITOT, PROT, ALBUMIN in the last 168 hours. No results for input(s): LIPASE, AMYLASE in the last 168 hours. No results for input(s): AMMONIA in the last 168 hours. CBC:  Recent Labs Lab 06/19/15 1410 06/20/15 0337 06/21/15 0036  WBC 7.5 7.3 6.5  NEUTROABS  --   --  3.8  HGB 12.2 10.6* 11.0*  HCT 36.8 31.2* 32.2*  MCV 89.0 88.7 88.7  PLT 200 179 170   Cardiac Enzymes:  Recent Labs Lab 06/19/15 1410  TROPONINI <0.03   BNP: BNP (last 3 results) No results for input(s): BNP in the last 8760 hours.  ProBNP (last 3 results) No results for input(s): PROBNP in the last 8760 hours.  CBG:  Recent Labs Lab 06/19/15 2128  GLUCAP 227*       Signed:  Delaynie Stetzer   06/24/2015, 4:51 PM

## 2015-11-08 ENCOUNTER — Encounter: Payer: Self-pay | Admitting: *Deleted

## 2015-11-08 DIAGNOSIS — E119 Type 2 diabetes mellitus without complications: Secondary | ICD-10-CM | POA: Insufficient documentation

## 2015-11-08 DIAGNOSIS — C801 Malignant (primary) neoplasm, unspecified: Secondary | ICD-10-CM | POA: Insufficient documentation

## 2015-11-08 DIAGNOSIS — I350 Nonrheumatic aortic (valve) stenosis: Secondary | ICD-10-CM | POA: Insufficient documentation

## 2015-11-08 DIAGNOSIS — I639 Cerebral infarction, unspecified: Secondary | ICD-10-CM | POA: Insufficient documentation

## 2015-11-09 ENCOUNTER — Ambulatory Visit (INDEPENDENT_AMBULATORY_CARE_PROVIDER_SITE_OTHER): Payer: Medicare Other | Admitting: Obstetrics and Gynecology

## 2015-11-09 ENCOUNTER — Encounter: Payer: Self-pay | Admitting: Obstetrics and Gynecology

## 2015-11-09 VITALS — BP 150/79 | HR 72 | Temp 98.3°F | Resp 16 | Ht 61.5 in | Wt 162.4 lb

## 2015-11-09 DIAGNOSIS — R300 Dysuria: Secondary | ICD-10-CM | POA: Diagnosis not present

## 2015-11-09 LAB — URINALYSIS, COMPLETE
BILIRUBIN UA: NEGATIVE
GLUCOSE, UA: NEGATIVE
Ketones, UA: NEGATIVE
LEUKOCYTES UA: NEGATIVE
Nitrite, UA: NEGATIVE
PH UA: 5.5 (ref 5.0–7.5)
Specific Gravity, UA: >1.03 — ABNORMAL HIGH (ref 1.005–1.030)
Urobilinogen, Ur: 1 mg/dL (ref 0.2–1.0)

## 2015-11-09 LAB — MICROSCOPIC EXAMINATION: BACTERIA UA: NONE SEEN

## 2015-11-09 LAB — BLADDER SCAN AMB NON-IMAGING: Scan Result: 22

## 2015-11-09 NOTE — Progress Notes (Signed)
11/09/2015 2:22 PM   KONICA MCHARG [birth year] [id number]  Referring provider: Tracie Harrier, MD [address] [facility] [place], [place] [address]  Chief Complaint  Patient presents with  . Dysuria  . Urinary Frequency  . Establish Care    HPI: Patient is a 82 year old female presenting today as a referral from her primary care provider for post urination vaginal burning. UA was checked by her primary care provider and was negative for infection. She denies any fevers, flank pain, dysuria or gross hematuria. She denies any other urinary symptoms.   She does report some vaginal dryness but no discharge or bleeding.  PMH: Past Medical History  Diagnosis Date  . Vertigo   . Cancer ([place])   . Stroke [facility])     TIA x3  . Diabetes mellitus without complication ([place])   . Hypertension   . Gastritis   . Seasonal allergies   . Back pain   . Aortic stenosis   . CVA (cerebral infarction)     Surgical History: Past Surgical History  Procedure Laterality Date  . Colon surgery    . Abdominal hysterectomy    . Back surgery    . Tonsillectomy      Home Medications:    Medication List       This list is accurate as of: 11/09/15  2:22 PM.  Always use your most recent med list.               aspirin EC 81 MG tablet  Take 81 mg by mouth every morning.     atorvastatin 10 MG tablet  Commonly known as:  LIPITOR  Take 10 mg by mouth 3 (three) times a week. Take on Monday, Wednesday, and Friday.     cloNIDine 0.1 MG tablet  Commonly known as:  CATAPRES  Take 1 tablet (0.1 mg total) by mouth every 6 (six) hours as needed (SBP>170).     clopidogrel 75 MG tablet  Commonly known as:  PLAVIX  Take 75 mg by mouth every morning.     conjugated estrogens vaginal cream  Commonly known as:  PREMARIN  Place vaginally.     Ferrous Sulfate 140 (45 Fe) MG Tbcr  Take 280 mg by mouth every morning.     fexofenadine 180 MG tablet  Commonly known as:  ALLEGRA   Take 1 tablet (180 mg total) by mouth daily.     fluticasone 50 MCG/ACT nasal spray  Commonly known as:  FLONASE  Place 2 sprays into both nostrils at bedtime.     furosemide 40 MG tablet  Commonly known as:  LASIX  Take 40 mg by mouth daily as needed. For edema     glimepiride 2 MG tablet  Commonly known as:  AMARYL  Take 3 mg by mouth daily with breakfast.     hydrALAZINE 25 MG tablet  Commonly known as:  APRESOLINE  Take 25 mg by mouth 3 (three) times daily.     ipratropium 0.03 % nasal spray  Commonly known as:  ATROVENT  Place 1 spray into the nose 3 (three) times daily.     latanoprost 0.005 % ophthalmic solution  Commonly known as:  XALATAN  Place 1 drop into both eyes at bedtime.     losartan 100 MG tablet  Commonly known as:  COZAAR  Take 100 mg by mouth daily.     omeprazole 20 MG capsule  Commonly known as:  PRILOSEC  Take 20 mg by  mouth 2 (two) times daily.     verapamil 180 MG CR tablet  Commonly known as:  CALAN-SR  Take 180 mg by mouth at bedtime.     vitamin B-12 1000 MCG tablet  Commonly known as:  CYANOCOBALAMIN  Take 1,000 mcg by mouth daily.     Vitamin D (Ergocalciferol) 50000 units Caps capsule  Commonly known as:  DRISDOL  Take 50,000 Units by mouth once a week.        Allergies:  Allergies  Allergen Reactions  . Lidocaine     Other reaction(s): Unknown  . Novocain [Procaine]   . Penicillins   . Sulfa Antibiotics   . Vancomycin     Other reaction(s): Unknown  . Codeine Rash    Family History: Family History  Problem Relation Age of Onset  . Hypertension      Social History:  reports that she has never smoked. She does not have any smokeless tobacco history on file. She reports that she does not drink alcohol or use illicit drugs.  ROS: UROLOGY Frequent Urination?: Yes Hard to postpone urination?: No Burning/pain with urination?: Yes Get up at night to urinate?: Yes Leakage of urine?: No Urine stream starts and  stops?: No Trouble starting stream?: No Do you have to strain to urinate?: No Blood in urine?: No Urinary tract infection?: Yes Sexually transmitted disease?: No Injury to kidneys or bladder?: No Painful intercourse?: No Weak stream?: No Currently pregnant?: No Vaginal bleeding?: No Last menstrual period?: n/a  Gastrointestinal Nausea?: No Vomiting?: No Indigestion/heartburn?: No Diarrhea?: No Constipation?: No  Constitutional Fever: No Night sweats?: No Weight loss?: No Fatigue?: No  Skin Skin rash/lesions?: No Itching?: No  Eyes Blurred vision?: No Double vision?: No  Ears/Nose/Throat Sore throat?: No Sinus problems?: Yes  Hematologic/Lymphatic Swollen glands?: No Easy bruising?: No  Cardiovascular Leg swelling?: Yes Chest pain?: No  Respiratory Cough?: No Shortness of breath?: No  Endocrine Excessive thirst?: No  Musculoskeletal Back pain?: No Joint pain?: No  Neurological Headaches?: No Dizziness?: No  Psychologic Depression?: No Anxiety?: No  Physical Exam: BP 150/79 mmHg  Pulse 72  Temp(Src) 98.3 F (36.8 C)  Resp 16  Ht 5' 1.5" (1.562 m)  Wt 162 lb 6.4 oz (73.664 kg)  BMI 30.19 kg/m2  Constitutional:  Alert and oriented, No acute distress. HEENT: [place] AT, moist mucus membranes.  Trachea midline, no masses. Cardiovascular: No clubbing, cyanosis, or edema. Respiratory: Normal respiratory effort, no increased work of breathing. GI: Abdomen is soft, nontender, nondistended, no abdominal masses GU: No CVA tenderness.  Atrophic vaginal mucosa, very narrow introitus to 2 atrophy limiting exam, no other rashes or lesions noted on external genitalia Skin: No rashes, bruises or suspicious lesions. Neurologic: Grossly intact, no focal deficits, moving all 4 extremities. Psychiatric: Normal mood and affect.  Laboratory Data:  Lab Results  Component Value Date   WBC 6.5 06/21/2015   HGB 11.0* 06/21/2015   HCT 32.2* 06/21/2015   MCV  88.7 06/21/2015   PLT 170 06/21/2015    Lab Results  Component Value Date   CREATININE 0.97 06/20/2015    No results found for: PSA  No results found for: TESTOSTERONE  No results found for: HGBA1C  Urinalysis    Component Value Date/Time   COLORURINE STRAW* 06/19/2015 1615   COLORURINE Straw 03/31/2013 1216   APPEARANCEUR CLEAR* 06/19/2015 1615   APPEARANCEUR Clear 03/31/2013 1216   LABSPEC 1.005 06/19/2015 1615   LABSPEC 1.009 03/31/2013 1216   PHURINE 7.0 06/19/2015  [place] 6.0 03/31/2013 1216   GLUCOSEU Negative 11/09/2015 0938   GLUCOSEU Negative 03/31/2013 1216   HGBUR 1+* 06/19/2015 1615   HGBUR 1+ 03/31/2013 1216   BILIRUBINUR Negative 11/09/2015 [place] 06/19/2015 1615   BILIRUBINUR Negative 03/31/2013 [place] 06/19/2015 1615   KETONESUR Negative 03/31/2013 1216   PROTEINUR 100* 06/19/2015 1615   PROTEINUR 30 mg/dL 03/31/2013 1216   NITRITE Negative 11/09/2015 0938   NITRITE NEGATIVE 06/19/2015 1615   NITRITE Negative 03/31/2013 1216   LEUKOCYTESUR Negative 11/09/2015 0938   LEUKOCYTESUR NEGATIVE 06/19/2015 1615   LEUKOCYTESUR Negative 03/31/2013 1216    Pertinent Imaging:  Assessment & Plan:   1. Vaginal atrophy- UA unremarkable today. PVR 30mL. Patient provided samples of Estrace cream today. She will follow-up for a recheck with [facility] in 1 month and reassess post void vaginal burning.  There are no diagnoses linked to this encounter.  Return in about 1 month (around 12/10/2015) for with [facility].  These notes generated with voice recognition software. I apologize for typographical errors.  Herbert Moors, [facility] [address] [place], [place] [address] [phone]

## 2015-11-09 NOTE — Patient Instructions (Signed)
Vaginal Atrophy- Vaginal Estrogen Cream apply a blueberry sized amount to vaginal opening using finger tip nightly for 2 weeks then use 3 times weekly before bed.  You can also use over counter coconut oil as a vaginal moisturizer.

## 2015-12-12 ENCOUNTER — Encounter: Payer: Self-pay | Admitting: Urology

## 2015-12-12 ENCOUNTER — Ambulatory Visit (INDEPENDENT_AMBULATORY_CARE_PROVIDER_SITE_OTHER): Payer: Medicare Other | Admitting: Urology

## 2015-12-12 VITALS — BP 151/70 | HR 65 | Ht 61.0 in | Wt 162.1 lb

## 2015-12-12 DIAGNOSIS — N952 Postmenopausal atrophic vaginitis: Secondary | ICD-10-CM | POA: Diagnosis not present

## 2015-12-12 MED ORDER — ESTROGENS, CONJUGATED 0.625 MG/GM VA CREA: 1.0000 | TOPICAL_CREAM | Freq: Every day | VAGINAL | Status: DC

## 2015-12-12 MED ORDER — ESTRADIOL 0.1 MG/GM VA CREA: TOPICAL_CREAM | VAGINAL | Status: DC

## 2015-12-12 NOTE — Progress Notes (Signed)
10:47 AM   FELIZA FUHRIMAN 08/04/1922 [id number]  Referring provider: Tracie Harrier, MD [address] [facility] [place], [place] [address]  Chief Complaint  Patient presents with  . Vaginitis    one month follow up     HPI: Patient is a 82 year old Caucasian female who was found to have vaginal atrophy and placed on vaginal estrogen cream one month ago and she presents today for follow-up.  She states the burning in the vaginal area has abated. She is applying the vaginal estrogen cream 3 nights weekly. She is not experiencing rash or irritation with the cream.  She does find it cost prohibitive.  She denies any fevers, flank pain, dysuria or gross hematuria. She denies any other urinary symptoms.   She does report some vaginal dryness but no discharge or bleeding.  PMH: Past Medical History  Diagnosis Date  . Vertigo   . Cancer ([place])   . Stroke [facility])     TIA x3  . Diabetes mellitus without complication ([place])   . Hypertension   . Gastritis   . Seasonal allergies   . Back pain   . Aortic stenosis   . CVA (cerebral infarction)   . Atrophic vaginitis     Surgical History: Past Surgical History  Procedure Laterality Date  . Colon surgery    . Abdominal hysterectomy    . Back surgery    . Tonsillectomy      Home Medications:    Medication List       This list is accurate as of: 12/12/15 10:47 AM.  Always use your most recent med list.               aspirin EC 81 MG tablet  Take 81 mg by mouth every morning.     atorvastatin 10 MG tablet  Commonly known as:  LIPITOR  Take 10 mg by mouth 3 (three) times a week. Take on Monday, Wednesday, and Friday.     cloNIDine 0.1 MG tablet  Commonly known as:  CATAPRES  Take 1 tablet (0.1 mg total) by mouth every 6 (six) hours as needed (SBP>170).     clopidogrel 75 MG tablet  Commonly known as:  PLAVIX  Take 75 mg by mouth every morning.     conjugated estrogens vaginal cream  Commonly known  as:  PREMARIN  Place vaginally.     conjugated estrogens vaginal cream  Commonly known as:  PREMARIN  Place 1 Applicatorful vaginally daily. Apply 0.5mg  (pea-sized amount)  just inside the vaginal introitus with a finger-tip every night for two weeks and then Monday, Wednesday and Friday nights.     estradiol 0.1 MG/GM vaginal cream  Commonly known as:  ESTRACE VAGINAL  Apply 0.5mg  (pea-sized amount)  just inside the vaginal introitus with a finger-tip every night for two weeks and then Monday, Wednesday and Friday nights.     Ferrous Sulfate 140 (45 Fe) MG Tbcr  Take 280 mg by mouth every morning.     fexofenadine 180 MG tablet  Commonly known as:  ALLEGRA  Take 1 tablet (180 mg total) by mouth daily.     fluticasone 50 MCG/ACT nasal spray  Commonly known as:  FLONASE  Place 2 sprays into both nostrils at bedtime.     furosemide 40 MG tablet  Commonly known as:  LASIX  Take 40 mg by mouth daily as needed. For edema patient states takes three times a week  glimepiride 2 MG tablet  Commonly known as:  AMARYL  Take 3 mg by mouth daily with breakfast.     hydrALAZINE 25 MG tablet  Commonly known as:  APRESOLINE  Take 25 mg by mouth 3 (three) times daily.     ipratropium 0.03 % nasal spray  Commonly known as:  ATROVENT  Place 1 spray into the nose 3 (three) times daily.     latanoprost 0.005 % ophthalmic solution  Commonly known as:  XALATAN  Place 1 drop into both eyes at bedtime.     losartan 100 MG tablet  Commonly known as:  COZAAR  Take 100 mg by mouth daily.     omeprazole 20 MG capsule  Commonly known as:  PRILOSEC  Take 20 mg by mouth 2 (two) times daily.     verapamil 180 MG CR tablet  Commonly known as:  CALAN-SR  Take 180 mg by mouth at bedtime.     vitamin B-12 1000 MCG tablet  Commonly known as:  CYANOCOBALAMIN  Take 1,000 mcg by mouth daily.     Vitamin D (Ergocalciferol) 50000 units Caps capsule  Commonly known as:  DRISDOL  Take 50,000 Units  by mouth once a week.        Allergies:  Allergies  Allergen Reactions  . Lidocaine     Other reaction(s): Unknown  . Novocain [Procaine]   . Penicillins   . Sulfa Antibiotics   . Vancomycin     Other reaction(s): Unknown  . Codeine Rash    Family History: Family History  Problem Relation Age of Onset  . Hypertension    . Bladder Cancer Neg Hx   . Kidney disease Neg Hx     Social History:  reports that she has never smoked. She does not have any smokeless tobacco history on file. She reports that she does not drink alcohol or use illicit drugs.  ROS: UROLOGY Frequent Urination?: No Hard to postpone urination?: No Burning/pain with urination?: No Get up at night to urinate?: Yes Leakage of urine?: No Urine stream starts and stops?: No Trouble starting stream?: No Do you have to strain to urinate?: No Blood in urine?: No Urinary tract infection?: No Sexually transmitted disease?: No Injury to kidneys or bladder?: No Painful intercourse?: No Weak stream?: No Currently pregnant?: No Vaginal bleeding?: No Last menstrual period?: n  Gastrointestinal Nausea?: No Vomiting?: No Indigestion/heartburn?: No Diarrhea?: No Constipation?: No  Constitutional Fever: No Night sweats?: No Weight loss?: No Fatigue?: No  Skin Skin rash/lesions?: No Itching?: No  Eyes Blurred vision?: No Double vision?: No  Ears/Nose/Throat Sore throat?: No Sinus problems?: Yes  Hematologic/Lymphatic Swollen glands?: No Easy bruising?: No  Cardiovascular Leg swelling?: No Chest pain?: No  Respiratory Cough?: No Shortness of breath?: No  Endocrine Excessive thirst?: No  Musculoskeletal Back pain?: No Joint pain?: No  Neurological Headaches?: No Dizziness?: No  Psychologic Depression?: No Anxiety?: No  Physical Exam: BP 151/70 mmHg  Pulse 65  Ht 5\' 1"  (1.549 m)  Wt 162 lb 1.6 oz (73.528 kg)  BMI 30.64 kg/m2  Constitutional:  Alert and oriented, No  acute distress. HEENT: [place] AT, moist mucus membranes.  Trachea midline, no masses. Cardiovascular: No clubbing, cyanosis, or edema. Respiratory: Normal respiratory effort, no increased work of breathing. GI: Abdomen is soft, nontender, nondistended, no abdominal masses GU: No CVA tenderness.  Skin: No rashes, bruises or suspicious lesions. Neurologic: Grossly intact, no focal deficits, moving all 4 extremities. Psychiatric: Normal mood and affect.  Laboratory Data:  Lab Results  Component Value Date   WBC 6.5 06/21/2015   HGB 11.0* 06/21/2015   HCT 32.2* 06/21/2015   MCV 88.7 06/21/2015   PLT 170 06/21/2015    Lab Results  Component Value Date   CREATININE 0.97 06/20/2015    Assessment & Plan:   1. Vaginal atrophy-  Patient provided samples of Estrace cream today. She will continue applying the vaginal estrogen cream 3 nights weekly. He return in 1 year for symptom recheck and exam.  Return in about 1 year (around 12/11/2016) for exam.  These notes generated with voice recognition software. I apologize for typographical errors.  Zara Council, [facility] [address] [place], [place] [address] [phone]

## 2016-02-08 ENCOUNTER — Other Ambulatory Visit: Payer: Self-pay | Admitting: Internal Medicine

## 2016-02-08 DIAGNOSIS — Z1231 Encounter for screening mammogram for malignant neoplasm of breast: Secondary | ICD-10-CM

## 2016-02-21 ENCOUNTER — Ambulatory Visit
Admission: RE | Admit: 2016-02-21 | Discharge: 2016-02-21 | Disposition: A | Discharge status: Home / Self Care | Payer: Medicare Other | Source: Ambulatory Visit | Attending: Internal Medicine | Admitting: Internal Medicine

## 2016-02-21 ENCOUNTER — Other Ambulatory Visit: Payer: Self-pay | Admitting: Internal Medicine

## 2016-02-21 DIAGNOSIS — Z1231 Encounter for screening mammogram for malignant neoplasm of breast: Secondary | ICD-10-CM

## 2016-05-23 ENCOUNTER — Encounter (INDEPENDENT_AMBULATORY_CARE_PROVIDER_SITE_OTHER): Payer: Self-pay

## 2016-07-23 ENCOUNTER — Other Ambulatory Visit (INDEPENDENT_AMBULATORY_CARE_PROVIDER_SITE_OTHER): Payer: Self-pay | Admitting: Vascular Surgery

## 2016-07-23 DIAGNOSIS — I6523 Occlusion and stenosis of bilateral carotid arteries: Secondary | ICD-10-CM

## 2016-07-27 ENCOUNTER — Encounter (INDEPENDENT_AMBULATORY_CARE_PROVIDER_SITE_OTHER): Payer: Self-pay | Admitting: Vascular Surgery

## 2016-07-27 ENCOUNTER — Ambulatory Visit (INDEPENDENT_AMBULATORY_CARE_PROVIDER_SITE_OTHER): Payer: Medicare Other | Admitting: Vascular Surgery

## 2016-07-27 ENCOUNTER — Ambulatory Visit (INDEPENDENT_AMBULATORY_CARE_PROVIDER_SITE_OTHER): Payer: Medicare Other

## 2016-07-27 VITALS — BP 147/82 | HR 78 | Resp 16 | Wt 157.2 lb

## 2016-07-27 DIAGNOSIS — I10 Essential (primary) hypertension: Secondary | ICD-10-CM | POA: Diagnosis not present

## 2016-07-27 DIAGNOSIS — I6523 Occlusion and stenosis of bilateral carotid arteries: Secondary | ICD-10-CM

## 2016-07-27 DIAGNOSIS — G450 Vertebro-basilar artery syndrome: Secondary | ICD-10-CM

## 2016-07-27 DIAGNOSIS — E119 Type 2 diabetes mellitus without complications: Secondary | ICD-10-CM

## 2016-07-27 NOTE — Assessment & Plan Note (Signed)
Previously documented with angiogram at [facility] many years ago and managed medically. Patient has done well with this. Continue medical management with aspirin, Plavix, and statin agent. Intervention very unlikely to be of benefit for her

## 2016-07-27 NOTE — Assessment & Plan Note (Signed)
blood pressure control important in reducing the progression of atherosclerotic disease. On appropriate oral medications.  

## 2016-07-27 NOTE — Progress Notes (Signed)
MRN : [id number]  Katie Graves is a 82 y.o. (03/30/1922) female who presents with chief complaint of  Chief Complaint  Patient presents with  . Follow-up  .  History of Present Illness: Patient returns in follow-up of her carotid artery disease and known basilar artery stenosis. Her basilar artery stenosis was decided to be treated medically many years ago at [facility]. She is doing well without focal neurologic symptoms. Specifically, the patient denies amaurosis fugax, speech or swallowing difficulties, or arm or leg weakness or numbness The patient's carotid duplex demonstrates stenosis just into the 60-79% range on the right and at the upper end of the 40-59% range on the left. This would be the below the threshold for prophylactic repair and an asymptomatic 82 year old.  Current Outpatient Prescriptions  Medication Sig Dispense Refill  . aspirin EC 81 MG tablet Take 81 mg by mouth every morning.    Marland Kitchen atorvastatin (LIPITOR) 10 MG tablet Take 10 mg by mouth 3 (three) times a week. Take on Monday, Wednesday, and Friday.    . cloNIDine (CATAPRES) 0.1 MG tablet Take 1 tablet (0.1 mg total) by mouth every 6 (six) hours as needed (SBP>170). 60 tablet 11  . clopidogrel (PLAVIX) 75 MG tablet Take 75 mg by mouth every morning.    . conjugated estrogens (PREMARIN) vaginal cream Place vaginally.    . conjugated estrogens (PREMARIN) vaginal cream Place 1 Applicatorful vaginally daily. Apply 0.5mg  (pea-sized amount)  just inside the vaginal introitus with a finger-tip every night for two weeks and then Monday, Wednesday and Friday nights. 30 g 12  . estradiol (ESTRACE VAGINAL) 0.1 MG/GM vaginal cream Apply 0.5mg  (pea-sized amount)  just inside the vaginal introitus with a finger-tip every night for two weeks and then Monday, Wednesday and Friday nights. 30 g 12  . Ferrous Sulfate 140 (45 FE) MG TBCR Take 280 mg by mouth every morning.    . fexofenadine (ALLEGRA) 180 MG tablet Take 1 tablet  (180 mg total) by mouth daily. 30 tablet 3  . fluticasone (FLONASE) 50 MCG/ACT nasal spray Place 2 sprays into both nostrils at bedtime.    Marland Kitchen glimepiride (AMARYL) 2 MG tablet Take 3 mg by mouth daily with breakfast.    . hydrALAZINE (APRESOLINE) 25 MG tablet Take 25 mg by mouth 3 (three) times daily.    Marland Kitchen ipratropium (ATROVENT) 0.03 % nasal spray Place 1 spray into the nose 3 (three) times daily.    Marland Kitchen latanoprost (XALATAN) 0.005 % ophthalmic solution Place 1 drop into both eyes at bedtime.    Marland Kitchen losartan (COZAAR) 100 MG tablet Take 100 mg by mouth daily.    . Multiple Vitamin (MULTIVITAMIN) capsule Take 1 capsule by mouth daily.    Marland Kitchen omeprazole (PRILOSEC) 20 MG capsule Take 20 mg by mouth 2 (two) times daily.    . Travoprost, BAK Free, (TRAVATAN) 0.004 % SOLN ophthalmic solution 1 drop at bedtime.    . valsartan-hydrochlorothiazide (DIOVAN-HCT) 320-12.5 MG tablet Take 1 tablet by mouth daily.    . verapamil (CALAN-SR) 180 MG CR tablet Take 180 mg by mouth at bedtime.    . vitamin B-12 (CYANOCOBALAMIN) 1000 MCG tablet Take 1,000 mcg by mouth daily.    . Vitamin D, Ergocalciferol, (DRISDOL) 50000 UNITS CAPS capsule Take 50,000 Units by mouth once a week.    . furosemide (LASIX) 40 MG tablet Take 40 mg by mouth daily as needed. For edema patient states takes three times a week  No current facility-administered medications for this visit.     Past Medical History:  Diagnosis Date  . Aortic stenosis   . Atrophic vaginitis   . Back pain   . Cancer ([place])   . CVA (cerebral infarction)   . Diabetes mellitus without complication ([place])   . Gastritis   . Hypertension   . Seasonal allergies   . Stroke [facility])    TIA x3  . Vertigo     Past Surgical History:  Procedure Laterality Date  . ABDOMINAL HYSTERECTOMY    . BACK SURGERY    . BREAST BIOPSY Left    stereotactic biopsy  . COLON SURGERY    . TONSILLECTOMY      Social History Social History  Substance Use Topics  . Smoking status:  Never Smoker  . Smokeless tobacco: Not on file  . Alcohol use No    Family History Family History  Problem Relation Age of Onset  . Hypertension    . Bladder Cancer Neg Hx   . Kidney disease Neg Hx     Allergies  Allergen Reactions  . Lidocaine     Other reaction(s): Unknown  . Novocain [Procaine]   . Penicillins   . Sulfa Antibiotics   . Vancomycin     Other reaction(s): Unknown  . Codeine Rash     REVIEW OF SYSTEMS (Negative unless checked)  Constitutional: [] Weight loss  [] Fever  [] Chills Cardiac: [] Chest pain   [] Chest pressure   [] Palpitations   [] Shortness of breath when laying flat   [] Shortness of breath at rest   [] Shortness of breath with exertion. Vascular:  [] Pain in legs with walking   [] Pain in legs at rest   [] Pain in legs when laying flat   [] Claudication   [] Pain in feet when walking  [] Pain in feet at rest  [] Pain in feet when laying flat   [] History of DVT   [] Phlebitis   [] Swelling in legs   [] Varicose veins   [] Non-healing ulcers Pulmonary:   [] Uses home oxygen   [] Productive cough   [] Hemoptysis   [] Wheeze  [] COPD   [] Asthma Neurologic:  [x] Dizziness  [] Blackouts   [] Seizures   [] History of stroke   [] History of TIA  [] Aphasia   [] Temporary blindness   [] Dysphagia   [] Weakness or numbness in arms   [] Weakness or numbness in legs Musculoskeletal:  [x] Arthritis   [] Joint swelling   [] Joint pain   [] Low back pain Hematologic:  [] Easy bruising  [] Easy bleeding   [] Hypercoagulable state   [] Anemic  [] Hepatitis Gastrointestinal:  [] Blood in stool   [] Vomiting blood  [] Gastroesophageal reflux/heartburn   [] Difficulty swallowing. Genitourinary:  [] Chronic kidney disease   [] Difficult urination  [] Frequent urination  [] Burning with urination   [] Blood in urine Skin:  [] Rashes   [] Ulcers   [] Wounds Psychological:  [] History of anxiety   []  History of major depression.  Physical Examination  Vitals:   07/27/16 1616 07/27/16 1617  BP: (!) 215/75 (!) 147/82    Pulse: 78   Resp: 16   Weight: 157 lb 3.2 oz (71.3 kg)    Body mass index is 29.7 kg/m. Gen:  WD/WN, NAD. Appears much younger than stated age Head: [place]/AT, No temporalis wasting. Ear/Nose/Throat: Hearing grossly intact, nares w/o erythema or drainage, trachea midline Eyes: Conjunctiva clear. Sclera non-icteric Neck: Supple.  No JVD.  Pulmonary:  Good air movement, equal and clear to auscultation bilaterally.  Cardiac: RRR, normal S1, S2, no Murmurs, rubs or gallops. Vascular:  Vessel Right Left  Radial Palpable Palpable  Ulnar Palpable Palpable  Brachial Palpable Palpable  Carotid Palpable, with bruit Palpable, with bruit                       Gastrointestinal: soft, non-tender/non-distended. No guarding/reflex.  Musculoskeletal: M/S 5/5 throughout.  No deformity or atrophy. Neurologic: CN 2-12 intact. Sensation grossly intact in extremities.  Symmetrical.  Speech is fluent. Motor exam as listed above. Psychiatric: Judgment intact, Mood & affect appropriate for pt's clinical situation. Dermatologic: No rashes or ulcers noted.  No cellulitis or open wounds. Lymph : No Cervical, Axillary, or Inguinal lymphadenopathy.     CBC Lab Results  Component Value Date   WBC 6.5 06/21/2015   HGB 11.0 (L) 06/21/2015   HCT 32.2 (L) 06/21/2015   MCV 88.7 06/21/2015   PLT 170 06/21/2015    BMET    Component Value Date/Time   NA 137 06/20/2015 2342   NA 133 (L) 04/21/2013 1031   K 3.2 (L) 06/20/2015 2342   K 4.6 04/21/2013 1031   CL 105 06/20/2015 2342   CL 99 04/21/2013 1031   CO2 25 06/20/2015 2342   CO2 27 04/21/2013 1031   GLUCOSE 156 (H) 06/20/2015 2342   GLUCOSE 162 (H) 04/21/2013 1031   BUN 23 (H) 06/20/2015 2342   BUN 24 (H) 04/21/2013 1031   CREATININE 0.97 06/20/2015 2342   CREATININE 1.27 04/21/2013 1031   CALCIUM 8.6 (L) 06/20/2015 2342   CALCIUM 9.4 04/21/2013 1031   GFRNONAA 50 (L) 06/20/2015 2342   GFRNONAA 37 (L) 04/21/2013 1031   GFRAA 57 (L)  06/20/2015 2342   GFRAA 43 (L) 04/21/2013 1031   CrCl cannot be calculated (Patient's most recent lab result is older than the maximum 21 days allowed.).  COAG Lab Results  Component Value Date   INR 1.0 03/30/2013    Radiology No results found.    Assessment/Plan Essential hypertension blood pressure control important in reducing the progression of atherosclerotic disease. On appropriate oral medications.   Type 2 diabetes mellitus (HCC) blood glucose control important in reducing the progression of atherosclerotic disease. Also, involved in wound healing. On appropriate medications.   Basilar artery syndrome Previously documented with angiogram at [facility] many years ago and managed medically. Patient has done well with this. Continue medical management with aspirin, Plavix, and statin agent. Intervention very unlikely to be of benefit for her  Carotid artery narrowing The patient's carotid duplex demonstrates stenosis just into the 60-79% range on the right and at the upper end of the 40-59% range on the left. This would be the below the threshold for prophylactic repair and an asymptomatic 82 year old. They're doing well without any focal neurologic symptoms. They will continue appropriate medical management with aspirin and a statin agent. We will plan to see them back in 6 months with follow-up duplex. They will contact our office with any problems or focal neurologic deficits in the interim.     Leotis Pain, MD  07/27/2016 4:30 PM    This note was created with Dragon medical transcription system.  Any errors from dictation are purely unintentional

## 2016-07-27 NOTE — Assessment & Plan Note (Signed)
blood glucose control important in reducing the progression of atherosclerotic disease. Also, involved in wound healing. On appropriate medications.  

## 2016-07-27 NOTE — Assessment & Plan Note (Signed)
The patient's carotid duplex demonstrates stenosis just into the 60-79% range on the right and at the upper end of the 40-59% range on the left. This would be the below the threshold for prophylactic repair and an asymptomatic 82 year old. They're doing well without any focal neurologic symptoms. They will continue appropriate medical management with aspirin and a statin agent. We will plan to see them back in 6 months with follow-up duplex. They will contact our office with any problems or focal neurologic deficits in the interim.

## 2016-09-11 DIAGNOSIS — I10 Essential (primary) hypertension: Secondary | ICD-10-CM | POA: Diagnosis not present

## 2016-09-11 DIAGNOSIS — I350 Nonrheumatic aortic (valve) stenosis: Secondary | ICD-10-CM | POA: Diagnosis not present

## 2016-09-11 DIAGNOSIS — J0101 Acute recurrent maxillary sinusitis: Secondary | ICD-10-CM | POA: Diagnosis not present

## 2016-09-11 DIAGNOSIS — E119 Type 2 diabetes mellitus without complications: Secondary | ICD-10-CM | POA: Diagnosis not present

## 2016-09-11 DIAGNOSIS — J310 Chronic rhinitis: Secondary | ICD-10-CM | POA: Diagnosis not present

## 2016-09-11 DIAGNOSIS — I6523 Occlusion and stenosis of bilateral carotid arteries: Secondary | ICD-10-CM | POA: Diagnosis not present

## 2016-09-11 DIAGNOSIS — J3089 Other allergic rhinitis: Secondary | ICD-10-CM | POA: Diagnosis not present

## 2016-09-18 DIAGNOSIS — I10 Essential (primary) hypertension: Secondary | ICD-10-CM | POA: Diagnosis not present

## 2016-09-18 DIAGNOSIS — E119 Type 2 diabetes mellitus without complications: Secondary | ICD-10-CM | POA: Diagnosis not present

## 2016-09-18 DIAGNOSIS — I6523 Occlusion and stenosis of bilateral carotid arteries: Secondary | ICD-10-CM | POA: Diagnosis not present

## 2016-09-18 DIAGNOSIS — J0191 Acute recurrent sinusitis, unspecified: Secondary | ICD-10-CM | POA: Diagnosis not present

## 2016-09-18 DIAGNOSIS — N181 Chronic kidney disease, stage 1: Secondary | ICD-10-CM | POA: Diagnosis not present

## 2016-09-18 DIAGNOSIS — I350 Nonrheumatic aortic (valve) stenosis: Secondary | ICD-10-CM | POA: Diagnosis not present

## 2016-09-18 DIAGNOSIS — C801 Malignant (primary) neoplasm, unspecified: Secondary | ICD-10-CM | POA: Diagnosis not present

## 2016-09-18 DIAGNOSIS — E1122 Type 2 diabetes mellitus with diabetic chronic kidney disease: Secondary | ICD-10-CM | POA: Diagnosis not present

## 2016-09-21 DIAGNOSIS — Z23 Encounter for immunization: Secondary | ICD-10-CM | POA: Diagnosis not present

## 2016-11-01 DIAGNOSIS — E119 Type 2 diabetes mellitus without complications: Secondary | ICD-10-CM | POA: Diagnosis not present

## 2016-11-01 DIAGNOSIS — I10 Essential (primary) hypertension: Secondary | ICD-10-CM | POA: Diagnosis not present

## 2016-11-01 DIAGNOSIS — J208 Acute bronchitis due to other specified organisms: Secondary | ICD-10-CM | POA: Diagnosis not present

## 2016-11-03 DIAGNOSIS — J159 Unspecified bacterial pneumonia: Secondary | ICD-10-CM | POA: Diagnosis not present

## 2016-11-03 DIAGNOSIS — R05 Cough: Secondary | ICD-10-CM | POA: Diagnosis not present

## 2016-11-05 ENCOUNTER — Emergency Department: Payer: Medicare Other

## 2016-11-05 ENCOUNTER — Encounter: Payer: Self-pay | Admitting: Emergency Medicine

## 2016-11-05 ENCOUNTER — Emergency Department
Admission: EM | Admit: 2016-11-05 | Discharge: 2016-11-05 | Disposition: A | Discharge status: Home / Self Care | Payer: Medicare Other | Attending: Emergency Medicine | Admitting: Emergency Medicine

## 2016-11-05 DIAGNOSIS — Z7984 Long term (current) use of oral hypoglycemic drugs: Secondary | ICD-10-CM | POA: Diagnosis not present

## 2016-11-05 DIAGNOSIS — R110 Nausea: Secondary | ICD-10-CM | POA: Diagnosis not present

## 2016-11-05 DIAGNOSIS — Z7982 Long term (current) use of aspirin: Secondary | ICD-10-CM | POA: Insufficient documentation

## 2016-11-05 DIAGNOSIS — J40 Bronchitis, not specified as acute or chronic: Secondary | ICD-10-CM

## 2016-11-05 DIAGNOSIS — I10 Essential (primary) hypertension: Secondary | ICD-10-CM | POA: Diagnosis not present

## 2016-11-05 DIAGNOSIS — E119 Type 2 diabetes mellitus without complications: Secondary | ICD-10-CM | POA: Diagnosis not present

## 2016-11-05 DIAGNOSIS — Z79899 Other long term (current) drug therapy: Secondary | ICD-10-CM | POA: Diagnosis not present

## 2016-11-05 DIAGNOSIS — R05 Cough: Secondary | ICD-10-CM | POA: Diagnosis not present

## 2016-11-05 LAB — CBC
HCT: 33.6 % — ABNORMAL LOW (ref 35.0–47.0)
Hemoglobin: 11.6 g/dL — ABNORMAL LOW (ref 12.0–16.0)
MCH: 30.1 pg (ref 26.0–34.0)
MCHC: 34.6 g/dL (ref 32.0–36.0)
MCV: 86.9 fL (ref 80.0–100.0)
PLATELETS: 191 10*3/uL (ref 150–440)
RBC: 3.86 MIL/uL (ref 3.80–5.20)
RDW: 14.6 % — AB (ref 11.5–14.5)
WBC: 6.6 10*3/uL (ref 3.6–11.0)

## 2016-11-05 LAB — COMPREHENSIVE METABOLIC PANEL
ALBUMIN: 3.5 g/dL (ref 3.5–5.0)
ALK PHOS: 50 U/L (ref 38–126)
ALT: 11 U/L — AB (ref 14–54)
AST: 35 U/L (ref 15–41)
Anion gap: 8 (ref 5–15)
BILIRUBIN TOTAL: 0.5 mg/dL (ref 0.3–1.2)
BUN: 18 mg/dL (ref 6–20)
CALCIUM: 8.7 mg/dL — AB (ref 8.9–10.3)
CO2: 25 mmol/L (ref 22–32)
CREATININE: 1.19 mg/dL — AB (ref 0.44–1.00)
Chloride: 98 mmol/L — ABNORMAL LOW (ref 101–111)
GFR calc non Af Amer: 38 mL/min — ABNORMAL LOW (ref >60)
GFR, EST AFRICAN AMERICAN: 44 mL/min — AB (ref >60)
GLUCOSE: 132 mg/dL — AB (ref 65–99)
Potassium: 4.1 mmol/L (ref 3.5–5.1)
SODIUM: 131 mmol/L — AB (ref 135–145)
TOTAL PROTEIN: 6 g/dL — AB (ref 6.5–8.1)

## 2016-11-05 LAB — LIPASE, BLOOD: Lipase: 31 U/L (ref 11–51)

## 2016-11-05 LAB — TROPONIN I

## 2016-11-05 MED ORDER — ONDANSETRON HCL 4 MG/2ML IJ SOLN
4.0000 mg | Freq: Once | INTRAMUSCULAR | Status: AC
  Administered 2016-11-05: 4 mg via INTRAVENOUS
  Filled 2016-11-05: qty 2

## 2016-11-05 MED ORDER — FAMOTIDINE 20 MG PO TABS: 20.0000 mg | ORAL_TABLET | Freq: Two times a day (BID) | ORAL | 1 refills | Status: DC

## 2016-11-05 MED ORDER — ALBUTEROL SULFATE HFA 108 (90 BASE) MCG/ACT IN AERS: 2.0000 | INHALATION_SPRAY | Freq: Four times a day (QID) | RESPIRATORY_TRACT | 2 refills | Status: DC | PRN

## 2016-11-05 MED ORDER — SODIUM CHLORIDE 0.9 % IV SOLN
Freq: Once | INTRAVENOUS | Status: AC
  Administered 2016-11-05: 11:00:00 via INTRAVENOUS

## 2016-11-05 MED ORDER — IPRATROPIUM-ALBUTEROL 0.5-2.5 (3) MG/3ML IN SOLN
3.0000 mL | Freq: Once | RESPIRATORY_TRACT | Status: AC
  Administered 2016-11-05: 3 mL via RESPIRATORY_TRACT
  Filled 2016-11-05: qty 3

## 2016-11-05 MED ORDER — PREDNISONE 20 MG PO TABS
40.0000 mg | ORAL_TABLET | Freq: Once | ORAL | Status: AC
  Administered 2016-11-05: 40 mg via ORAL
  Filled 2016-11-05: qty 2

## 2016-11-05 MED ORDER — METHYLPREDNISOLONE 4 MG PO TBPK: ORAL_TABLET | ORAL | 0 refills | Status: DC

## 2016-11-05 NOTE — ED Provider Notes (Signed)
[facility] Emergency Department Provider Note        Time seen: ----------------------------------------- 11:10 AM on 11/05/2016 -----------------------------------------    I have reviewed the triage vital signs and the nursing notes.   HISTORY  Chief Complaint Abdominal Pain    HPI KENNEDE PROSEN is a 83 y.o. female who presents the ER for epigastric pain for the last several weeks. Patient was diagnosed with pneumonia 2 days ago and is currently taking Levaquin. Patient states she is not really having pain she just has nausea and poor appetite. She denies fevers, chills but does have persistent cough. Patient states she feels like she cannot expectorate what she needs to. She denies diarrhea or other complaints.   Past Medical History:  Diagnosis Date  . Aortic stenosis   . Atrophic vaginitis   . Back pain   . Cancer ([place])   . CVA (cerebral infarction)   . Diabetes mellitus without complication ([place])   . Gastritis   . Hypertension   . Seasonal allergies   . Stroke [facility])    TIA x3  . Vertigo     Patient Active Problem List   Diagnosis Date Noted  . Essential hypertension 07/27/2016  . Basilar artery syndrome 07/27/2016  . Atrophic vaginitis 12/12/2015  . Aortic heart valve narrowing 11/08/2015  . Malignant neoplastic disease ([place]) 11/08/2015  . Type 2 diabetes mellitus ([place]) 11/08/2015  . Cerebrovascular accident (CVA) ([place]) 11/08/2015  . Accelerated hypertension 06/19/2015  . Carotid artery narrowing 09/06/2014    Past Surgical History:  Procedure Laterality Date  . ABDOMINAL HYSTERECTOMY    . BACK SURGERY    . BREAST BIOPSY Left    stereotactic biopsy  . COLON SURGERY    . TONSILLECTOMY      Allergies Lidocaine; Novocain [procaine]; Penicillins; Sulfa antibiotics; Vancomycin; and Codeine  Social History Social History  Substance Use Topics  . Smoking status: Never Smoker  . Smokeless tobacco: Not on file  . Alcohol  use No    Review of Systems Constitutional: Negative for fever. Cardiovascular: Negative for chest pain. Respiratory: Positive for persistent cough Gastrointestinal: Positive for nausea Genitourinary: Negative for dysuria. Musculoskeletal: Negative for back pain. Skin: Negative for rash. Neurological: Negative for headaches, focal weakness or numbness.  10-point ROS otherwise negative.  ____________________________________________   PHYSICAL EXAM:  VITAL SIGNS: ED Triage Vitals  Enc Vitals Group     BP 11/05/16 1030 (!) 220/61     Pulse Rate 11/05/16 1030 81     Resp 11/05/16 1030 20     Temp 11/05/16 1030 99.3 F (37.4 C)     Temp Source 11/05/16 1030 Oral     SpO2 11/05/16 1030 96 %     Weight 11/05/16 1031 150 lb (68 kg)     Height 11/05/16 1031 5\' 1"  (1.549 m)     Head Circumference --      Peak Flow --      Pain Score 11/05/16 1031 0     Pain Loc --      Pain Edu? --      Excl. in [place]? --     Constitutional: Alert and oriented. Well appearing and in no distress. Eyes: Conjunctivae are normal. PERRL. Normal extraocular movements. ENT   Head: Normocephalic and atraumatic.   Nose: No congestion/rhinnorhea.   Mouth/Throat: Mucous membranes are moist.   Neck: No stridor. Cardiovascular: Normal rate, regular rhythm. No murmurs, rubs, or gallops. Respiratory: Mild rhonchorous breath sounds bilaterally Gastrointestinal: Soft  and nontender. Normal bowel sounds Musculoskeletal: Nontender with normal range of motion in all extremities. No lower extremity tenderness nor edema. Neurologic:  Normal speech and language. No gross focal neurologic deficits are appreciated.  Skin:  Skin is warm, dry and intact. No rash noted. Psychiatric: Mood and affect are normal. Speech and behavior are normal.  ____________________________________________  EKG: Interpreted by me. Sinus rhythm rate of 77 bpm, normal PR interval, LVH, normal QT, leftward  axis  ____________________________________________  ED COURSE:  Pertinent labs & imaging results that were available during my care of the patient were reviewed by me and considered in my medical decision making (see chart for details). Patient presents the ER with difficulty breathing and nausea. We will give DuoNeb since steroids, check basic labs and x-rays.   Procedures ____________________________________________   LABS (pertinent positives/negatives)  Labs Reviewed  COMPREHENSIVE METABOLIC PANEL - Abnormal; Notable for the following:       Result Value   Sodium 131 (*)    Chloride 98 (*)    Glucose, Bld 132 (*)    Creatinine, Ser 1.19 (*)    Calcium 8.7 (*)    Total Protein 6.0 (*)    ALT 11 (*)    GFR calc non Af Amer 38 (*)    GFR calc Af Amer 44 (*)    All other components within normal limits  CBC - Abnormal; Notable for the following:    Hemoglobin 11.6 (*)    HCT 33.6 (*)    RDW 14.6 (*)    All other components within normal limits  LIPASE, BLOOD  TROPONIN I    RADIOLOGY  Chest x-ray Is unremarkable ____________________________________________  FINAL ASSESSMENT AND PLAN  Bronchitis, nausea  Plan: Patient with labs and imaging as dictated above. Patient is in no acute distress and currently feeling better after DuoNeb and steroids. I will advise holding the antibiotics as it is upsetting her stomach. She does need to add an antacid, I will prescribe a short supply of steroids and albuterol.   Earleen Newport, MD   Note: This note was generated in part or whole with voice recognition software. Voice recognition is usually quite accurate but there are transcription errors that can and very often do occur. I apologize for any typographical errors that were not detected and corrected.     Earleen Newport, MD 11/05/16 1228

## 2016-11-05 NOTE — ED Triage Notes (Signed)
Epigastric pain x 2 weeks. Diagnosed with pneumonia 2 days ago. On antibiotics.

## 2016-11-14 DIAGNOSIS — H40153 Residual stage of open-angle glaucoma, bilateral: Secondary | ICD-10-CM | POA: Diagnosis not present

## 2016-12-12 ENCOUNTER — Ambulatory Visit: Payer: Medicare Other | Admitting: Urology

## 2017-01-09 DIAGNOSIS — I350 Nonrheumatic aortic (valve) stenosis: Secondary | ICD-10-CM | POA: Diagnosis not present

## 2017-01-09 DIAGNOSIS — I10 Essential (primary) hypertension: Secondary | ICD-10-CM | POA: Diagnosis not present

## 2017-01-09 DIAGNOSIS — D649 Anemia, unspecified: Secondary | ICD-10-CM | POA: Diagnosis not present

## 2017-01-09 DIAGNOSIS — E119 Type 2 diabetes mellitus without complications: Secondary | ICD-10-CM | POA: Diagnosis not present

## 2017-01-09 DIAGNOSIS — I6523 Occlusion and stenosis of bilateral carotid arteries: Secondary | ICD-10-CM | POA: Diagnosis not present

## 2017-01-09 DIAGNOSIS — J0191 Acute recurrent sinusitis, unspecified: Secondary | ICD-10-CM | POA: Diagnosis not present

## 2017-01-09 DIAGNOSIS — C801 Malignant (primary) neoplasm, unspecified: Secondary | ICD-10-CM | POA: Diagnosis not present

## 2017-01-16 DIAGNOSIS — K2900 Acute gastritis without bleeding: Secondary | ICD-10-CM | POA: Diagnosis not present

## 2017-01-16 DIAGNOSIS — Z0000 Encounter for general adult medical examination without abnormal findings: Secondary | ICD-10-CM | POA: Diagnosis not present

## 2017-01-16 DIAGNOSIS — E119 Type 2 diabetes mellitus without complications: Secondary | ICD-10-CM | POA: Diagnosis not present

## 2017-01-16 DIAGNOSIS — C801 Malignant (primary) neoplasm, unspecified: Secondary | ICD-10-CM | POA: Diagnosis not present

## 2017-01-16 DIAGNOSIS — D649 Anemia, unspecified: Secondary | ICD-10-CM | POA: Diagnosis not present

## 2017-01-16 DIAGNOSIS — I10 Essential (primary) hypertension: Secondary | ICD-10-CM | POA: Diagnosis not present

## 2017-01-17 DIAGNOSIS — D649 Anemia, unspecified: Secondary | ICD-10-CM | POA: Diagnosis not present

## 2017-01-24 ENCOUNTER — Other Ambulatory Visit: Payer: Self-pay | Admitting: Nurse Practitioner

## 2017-01-24 DIAGNOSIS — D508 Other iron deficiency anemias: Secondary | ICD-10-CM | POA: Diagnosis not present

## 2017-01-24 DIAGNOSIS — K921 Melena: Secondary | ICD-10-CM

## 2017-01-24 DIAGNOSIS — Z85038 Personal history of other malignant neoplasm of large intestine: Secondary | ICD-10-CM

## 2017-01-24 DIAGNOSIS — R195 Other fecal abnormalities: Secondary | ICD-10-CM | POA: Diagnosis not present

## 2017-01-28 ENCOUNTER — Ambulatory Visit
Admission: RE | Admit: 2017-01-28 | Discharge: 2017-01-28 | Disposition: A | Discharge status: Home / Self Care | Payer: Medicare Other | Source: Ambulatory Visit | Attending: Nurse Practitioner | Admitting: Nurse Practitioner

## 2017-01-28 DIAGNOSIS — I700 Atherosclerosis of aorta: Secondary | ICD-10-CM | POA: Insufficient documentation

## 2017-01-28 DIAGNOSIS — K5730 Diverticulosis of large intestine without perforation or abscess without bleeding: Secondary | ICD-10-CM | POA: Diagnosis not present

## 2017-01-28 DIAGNOSIS — Z85038 Personal history of other malignant neoplasm of large intestine: Secondary | ICD-10-CM | POA: Diagnosis present

## 2017-01-28 DIAGNOSIS — K921 Melena: Secondary | ICD-10-CM

## 2017-01-28 DIAGNOSIS — D508 Other iron deficiency anemias: Secondary | ICD-10-CM | POA: Diagnosis not present

## 2017-01-28 DIAGNOSIS — R195 Other fecal abnormalities: Secondary | ICD-10-CM | POA: Diagnosis present

## 2017-02-01 ENCOUNTER — Ambulatory Visit (INDEPENDENT_AMBULATORY_CARE_PROVIDER_SITE_OTHER): Payer: Medicare Other | Admitting: Vascular Surgery

## 2017-02-01 ENCOUNTER — Ambulatory Visit (INDEPENDENT_AMBULATORY_CARE_PROVIDER_SITE_OTHER): Payer: Medicare Other

## 2017-02-01 ENCOUNTER — Encounter (INDEPENDENT_AMBULATORY_CARE_PROVIDER_SITE_OTHER): Payer: Self-pay | Admitting: Vascular Surgery

## 2017-02-01 VITALS — BP 205/60 | HR 65 | Resp 16 | Ht 61.0 in | Wt 151.0 lb

## 2017-02-01 DIAGNOSIS — I6523 Occlusion and stenosis of bilateral carotid arteries: Secondary | ICD-10-CM | POA: Diagnosis not present

## 2017-02-01 DIAGNOSIS — E119 Type 2 diabetes mellitus without complications: Secondary | ICD-10-CM | POA: Diagnosis not present

## 2017-02-01 DIAGNOSIS — G450 Vertebro-basilar artery syndrome: Secondary | ICD-10-CM | POA: Diagnosis not present

## 2017-02-01 DIAGNOSIS — K922 Gastrointestinal hemorrhage, unspecified: Secondary | ICD-10-CM | POA: Diagnosis not present

## 2017-02-01 DIAGNOSIS — I10 Essential (primary) hypertension: Secondary | ICD-10-CM

## 2017-02-01 NOTE — Assessment & Plan Note (Signed)
Carotid duplex today shows stable stenosis in the 60-79% range in bilateral carotid arteries. I would just continue her current medical regimen with aspirin, Plavix, and a statin agent. Recheck in 6 months.

## 2017-02-01 NOTE — Assessment & Plan Note (Signed)
Medical management alone. No role for intervention

## 2017-02-01 NOTE — Assessment & Plan Note (Signed)
This is a chronic, slow GI bleed and I do not think that angiography would be of much benefit at localizing the source or determining the pathology. If she is not well enough for a colonoscopy, I don't think any intervention would be warranted.

## 2017-02-01 NOTE — Assessment & Plan Note (Signed)
blood pressure control important in reducing the progression of atherosclerotic disease. On appropriate oral medications.  

## 2017-02-01 NOTE — Assessment & Plan Note (Signed)
blood glucose control important in reducing the progression of atherosclerotic disease. Also, involved in wound healing. On appropriate medications.  

## 2017-02-01 NOTE — Progress Notes (Signed)
MRN : [id number]  Katie Graves is a 83 y.o. (1922/04/28) female who presents with chief complaint of  Chief Complaint  Patient presents with  . Follow-up  .  History of Present Illness: Patient returns in follow-up of her carotid disease. She reports she has had issues with blood in her stool and she had a recent CT scan which I have independently reviewed along with her which demonstrated a possible abnormality in the right upper quadrant at the hepatic flexure. She is already had a right hemicolectomy for cancer about 20 years ago. She is asymptomatic other than the mild anemia which says her hemoglobin is about 10. She says her gastroenterology team had discussed other tests but did not feel she was well enough for colonoscopy. They wondered if an angiogram would be helpful for the site of bleeding. As for her carotid disease, she denies any focal neurologic symptoms. Specifically, the patient denies amaurosis fugax, speech or swallowing difficulties, or arm or leg weakness or numbness. Carotid duplex today shows stable stenosis in the 60-79% range in bilateral carotid arteries.  Current Outpatient Prescriptions  Medication Sig Dispense Refill  . aspirin EC 81 MG tablet Take 81 mg by mouth every morning.    Marland Kitchen atorvastatin (LIPITOR) 10 MG tablet Take 10 mg by mouth daily at 6 PM. Take on Monday, Wednesday, and Friday.    . clopidogrel (PLAVIX) 75 MG tablet Take 75 mg by mouth every morning.    . Ferrous Sulfate 140 (45 FE) MG TBCR Take 280 mg by mouth every morning.    . fexofenadine (ALLEGRA) 180 MG tablet Take 1 tablet (180 mg total) by mouth daily. 30 tablet 3  . fluticasone (FLONASE) 50 MCG/ACT nasal spray Place 2 sprays into both nostrils at bedtime.    Marland Kitchen glimepiride (AMARYL) 2 MG tablet Take 2.5 mg by mouth daily with breakfast.     . hydrALAZINE (APRESOLINE) 25 MG tablet Take 25 mg by mouth 3 (three) times daily.    Marland Kitchen ipratropium (ATROVENT) 0.03 % nasal spray Place 2 sprays into  both nostrils 3 (three) times daily.    Marland Kitchen latanoprost (XALATAN) 0.005 % ophthalmic solution Place 1 drop into both eyes at bedtime.    Marland Kitchen losartan (COZAAR) 100 MG tablet Take 100 mg by mouth daily.    . verapamil (CALAN-SR) 180 MG CR tablet Take 180 mg by mouth at bedtime.    . vitamin B-12 (CYANOCOBALAMIN) 1000 MCG tablet Take 1,000 mcg by mouth daily.    . Vitamin D, Ergocalciferol, (DRISDOL) 50000 UNITS CAPS capsule Take 50,000 Units by mouth once a week.    Marland Kitchen albuterol (PROVENTIL HFA;VENTOLIN HFA) 108 (90 Base) MCG/ACT inhaler Inhale 2 puffs into the lungs every 6 (six) hours as needed for wheezing or shortness of breath. (Patient not taking: Reported on 02/01/2017) 1 Inhaler 2  . ALPRAZolam (XANAX) 0.25 MG tablet Take 0.25 mg by mouth at bedtime.    . benzonatate (TESSALON) 200 MG capsule Take 200 mg by mouth.    . cloNIDine (CATAPRES) 0.1 MG tablet Take 1 tablet (0.1 mg total) by mouth every 6 (six) hours as needed (SBP>170). (Patient not taking: Reported on 02/01/2017) 60 tablet 11  . conjugated estrogens (PREMARIN) vaginal cream Place 1 Applicatorful vaginally daily. Apply 0.5mg  (pea-sized amount)  just inside the vaginal introitus with a finger-tip every night for two weeks and then Monday, Wednesday and Friday nights. (Patient not taking: Reported on 11/05/2016) 30 g 12  . cyclobenzaprine (FLEXERIL)  5 MG tablet Take 5 mg by mouth 2 (two) times daily as needed for muscle spasms.    Marland Kitchen estradiol (ESTRACE VAGINAL) 0.1 MG/GM vaginal cream Apply 0.5mg  (pea-sized amount)  just inside the vaginal introitus with a finger-tip every night for two weeks and then Monday, Wednesday and Friday nights. (Patient not taking: Reported on 11/05/2016) 30 g 12  . famotidine (PEPCID) 20 MG tablet Take 1 tablet (20 mg total) by mouth 2 (two) times daily. (Patient not taking: Reported on 02/01/2017) 60 tablet 1  . gabapentin (NEURONTIN) 100 MG capsule Take 100 mg by mouth at bedtime.    Marland Kitchen levofloxacin (LEVAQUIN) 500 MG  tablet Take 500 mg by mouth daily.    . methylPREDNISolone (MEDROL) 4 MG TBPK tablet Dispense Medrol Dosepak as directed (Patient not taking: Reported on 02/01/2017) 21 tablet 0  . ondansetron (ZOFRAN) 4 MG tablet Take 4 mg by mouth every 8 (eight) hours as needed for nausea or vomiting.    . pantoprazole (PROTONIX) 40 MG tablet Take 40 mg by mouth daily.    . Travoprost, BAK Free, (TRAVATAN) 0.004 % SOLN ophthalmic solution 1 drop at bedtime.     No current facility-administered medications for this visit.     Past Medical History:  Diagnosis Date  . Aortic stenosis   . Atrophic vaginitis   . Back pain   . Cancer ([place])   . CVA (cerebral infarction)   . Diabetes mellitus without complication ([place])   . Gastritis   . Hypertension   . Seasonal allergies   . Stroke [facility])    TIA x3  . Vertigo     Past Surgical History:  Procedure Laterality Date  . ABDOMINAL HYSTERECTOMY    . BACK SURGERY    . BREAST BIOPSY Left    stereotactic biopsy  . COLON SURGERY    . TONSILLECTOMY      Social History Social History  Substance Use Topics  . Smoking status: Never Smoker  . Smokeless tobacco: Never Used  . Alcohol use No  No IVDU  Family History Family History  Problem Relation Age of Onset  . Hypertension Unknown   . Bladder Cancer Neg Hx   . Kidney disease Neg Hx   No bleeding or clotting disorders, no aneurysms, no autoimmune diseases  Allergies  Allergen Reactions  . Lidocaine Nausea Only    Other reaction(s): Unknown  . Novocain [Procaine] Nausea Only  . Penicillins     Has patient had a PCN reaction causing immediate rash, facial/tongue/throat swelling, SOB or lightheadedness with hypotension: no Has patient had a PCN reaction causing severe rash involving mucus membranes or skin necrosis: yes Has patient had a PCN reaction that required hospitalization no Has patient had a PCN reaction occurring within the last 10 years: no If all of the above answers are "NO", then  may proceed with Cephalosporin use.   . Sulfa Antibiotics Nausea Only  . Vancomycin Nausea Only    Other reaction(s): Unknown  . Codeine Rash     REVIEW OF SYSTEMS (Negative unless checked)  Constitutional: [] Weight loss  [] Fever  [] Chills Cardiac: [] Chest pain   [] Chest pressure   [] Palpitations   [] Shortness of breath when laying flat   [] Shortness of breath at rest   [x] Shortness of breath with exertion. Vascular:  [] Pain in legs with walking   [] Pain in legs at rest   [] Pain in legs when laying flat   [] Claudication   [] Pain in feet when walking  [] Pain  in feet at rest  [] Pain in feet when laying flat   [] History of DVT   [] Phlebitis   [] Swelling in legs   [] Varicose veins   [] Non-healing ulcers Pulmonary:   [] Uses home oxygen   [] Productive cough   [] Hemoptysis   [] Wheeze  [] COPD   [] Asthma Neurologic:  [] Dizziness  [] Blackouts   [] Seizures   [x] History of stroke   [x] History of TIA  [] Aphasia   [] Temporary blindness   [] Dysphagia   [] Weakness or numbness in arms   [] Weakness or numbness in legs Musculoskeletal:  [x] Arthritis   [] Joint swelling   [] Joint pain   [] Low back pain Hematologic:  [] Easy bruising  [] Easy bleeding   [] Hypercoagulable state   [] Anemic  [] Hepatitis Gastrointestinal:  [x] Blood in stool   [] Vomiting blood  [x] Gastroesophageal reflux/heartburn   [] Difficulty swallowing. Genitourinary:  [] Chronic kidney disease   [] Difficult urination  [] Frequent urination  [] Burning with urination   [] Blood in urine Skin:  [] Rashes   [] Ulcers   [] Wounds Psychological:  [] History of anxiety   []  History of major depression.  Physical Examination  Vitals:   02/01/17 1407  BP: (!) 205/60  Pulse: 65  Resp: 16  Weight: 151 lb (68.5 kg)  Height: 5\' 1"  (1.549 m)   Body mass index is 28.53 kg/m. Gen:  WD/WN, NAD. Appears younger than stated age Head: [place]/AT, No temporalis wasting. Ear/Nose/Throat: Hearing grossly intact, nares w/o erythema or drainage, trachea midline Eyes:  Conjunctiva clear. Sclera non-icteric Neck: Supple.  No JVD.  Pulmonary:  Good air movement, equal bilaterally.  Cardiac: RRR Vascular:  Vessel Right Left  Radial Palpable Palpable                                   Gastrointestinal: soft, non-tender/non-distended. No guarding/reflex.  Musculoskeletal: M/S 5/5 throughout.  No deformity or atrophy Neurologic: CN 2-12 intact. Sensation grossly intact in extremities.  Symmetrical.  Speech is fluent. Motor exam as listed above. Psychiatric: Judgment intact, Mood & affect appropriate for pt's clinical situation. Dermatologic: No rashes or ulcers noted.  No cellulitis or open wounds.     CBC Lab Results  Component Value Date   WBC 6.6 11/05/2016   HGB 11.6 (L) 11/05/2016   HCT 33.6 (L) 11/05/2016   MCV 86.9 11/05/2016   PLT 191 11/05/2016    BMET    Component Value Date/Time   NA 131 (L) 11/05/2016 1035   NA 133 (L) 04/21/2013 1031   K 4.1 11/05/2016 1035   K 4.6 04/21/2013 1031   CL 98 (L) 11/05/2016 1035   CL 99 04/21/2013 1031   CO2 25 11/05/2016 1035   CO2 27 04/21/2013 1031   GLUCOSE 132 (H) 11/05/2016 1035   GLUCOSE 162 (H) 04/21/2013 1031   BUN 18 11/05/2016 1035   BUN 24 (H) 04/21/2013 1031   CREATININE 1.19 (H) 11/05/2016 1035   CREATININE 1.27 04/21/2013 1031   CALCIUM 8.7 (L) 11/05/2016 1035   CALCIUM 9.4 04/21/2013 1031   GFRNONAA 38 (L) 11/05/2016 1035   GFRNONAA 37 (L) 04/21/2013 1031   GFRAA 44 (L) 11/05/2016 1035   GFRAA 43 (L) 04/21/2013 1031   CrCl cannot be calculated (Patient's most recent lab result is older than the maximum 21 days allowed.).  COAG Lab Results  Component Value Date   INR 1.0 03/30/2013    Radiology Ct Abdomen Pelvis Wo Contrast  Result Date: 01/29/2017 CLINICAL DATA:  83 year old  diabetic hypertensive female with occult blood in stools. Colon cancer 1987 post colonic resection. Appendectomy and hysterectomy. History of gastritis. Mid upper abdominal tenderness.  Initial encounter. EXAM: CT ABDOMEN AND PELVIS WITHOUT CONTRAST TECHNIQUE: Multidetector CT imaging of the abdomen and pelvis was performed following the standard protocol without IV contrast. COMPARISON:  07/07/2008. FINDINGS: Lower chest: Minimal scarring/atelectasis lung bases. Heart size within normal limits. Prominent mitral valve calcification. Calcification aortic valve. Coronary artery calcification. Contrast in distal esophagus suggestive of reflux. Hepatobiliary: Taking into account limitation by non contrast imaging, no worrisome hepatic lesion. Contracted gallbladder without calcified stone. Pancreas: Taking into account limitation by non contrast imaging, no mass or inflammation. Spleen: Taking into account limitation by non contrast imaging, no worrisome mass or enlargement. Tiny calcifications. Adrenals/Urinary Tract: Adrenal gland hyperplasia as previously noted. Extra renal pelvis and parapelvic cyst once again noted without renal or ureteral obstructing stone. Taking into account limitation by non contrast imaging, no worrisome renal mass. Question tiny nonobstructing right upper pole renal calculi. Noncontrast filled views of the urinary bladder unremarkable. Stomach/Bowel: Post right colectomy/appendectomy. Significant diverticulosis descending colon and sigmoid colon without extraluminal bowel inflammatory process. Secondary to presence of stool and underdistention, limited for evaluating for the presence of colonic lesion as a source of the patient's occult blood. In addition to muscular hypertrophy and underdistention of majority of the sigmoid colon and descending colon filling defects are seen in the hepatic and splenic flexure and rectal region which may represent stool/under distension. Narrowing most notable hepatic flexure level. No discrete gastric abnormality noted. Vascular/Lymphatic: Atherosclerotic changes aorta and iliac arteries without focal aneurysm. Scattered retroperitoneal  normal size lymph nodes similar to prior exam. Reproductive: Post hysterectomy.  No adnexal mass noted. Other: Tiny fatty containing periumbilical hernia. No free intraperitoneal air. Musculoskeletal: Facet degenerative changes with minimal anterior slip L4. Prior laminectomy. Degenerative changes pubic symphysis and sacroiliac joints. IMPRESSION: Post right colectomy/appendectomy. Significant diverticulosis descending colon and sigmoid colon with muscular hypertrophy without acute inflammation. Narrowing/ filling defects are seen in the hepatic flexure, splenic flexure and rectal region which may represent stool/under distension although limiting evaluation for detection of underlying mass. Narrowing most notable hepatic flexure level (series 5, images 30 through 32). Contrast in distal esophagus suggestive of reflux. Aortic atherosclerosis. Electronically Signed   By: Genia Del M.D.   On: 01/29/2017 07:54     Assessment/Plan Basilar artery syndrome Medical management alone. No role for intervention  Type 2 diabetes mellitus (HCC) blood glucose control important in reducing the progression of atherosclerotic disease. Also, involved in wound healing. On appropriate medications.   Essential hypertension blood pressure control important in reducing the progression of atherosclerotic disease. On appropriate oral medications.   GI bleed This is a chronic, slow GI bleed and I do not think that angiography would be of much benefit at localizing the source or determining the pathology. If she is not well enough for a colonoscopy, I don't think any intervention would be warranted.  Carotid artery narrowing Carotid duplex today shows stable stenosis in the 60-79% range in bilateral carotid arteries. I would just continue her current medical regimen with aspirin, Plavix, and a statin agent. Recheck in 6 months.    Leotis Pain, MD  02/01/2017 3:32 PM    This note was created with Dragon medical  transcription system.  Any errors from dictation are purely unintentional

## 2017-03-02 IMAGING — CT CT ANGIO NECK
2 of 9 series · 7 of 33 positions shown · IV contrast (APPLIED)
Comparison: Soft tissue neck CT 12/21/2013. Brain MRI 03/30/2013.
Neck CTA 06/14/2011.

CLINICAL DATA: Lightheadedness today.  Hypotension.

EXAM:
CT ANGIOGRAPHY HEAD AND NECK
TECHNIQUE: Multidetector CT imaging of the head and neck was performed using
the standard protocol during bolus administration of intravenous
contrast. Multiplanar CT image reconstructions and MIPs were
obtained to evaluate the vascular anatomy. Carotid stenosis
measurements (when applicable) are obtained utilizing NASCET
criteria, using the distal internal carotid diameter as the
denominator.
CONTRAST:  75mL OMNIPAQUE IOHEXOL 350 MG/ML SOLN

[Series 6: cta head · axial · 0.45mm/px · z∈[+208,+356]mm · 3 of 296 slices shown]
[im 74/296  soft-tissue]
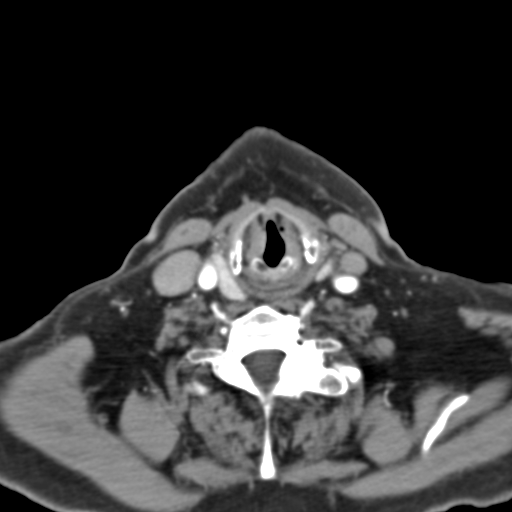
[im 148/296  soft-tissue]
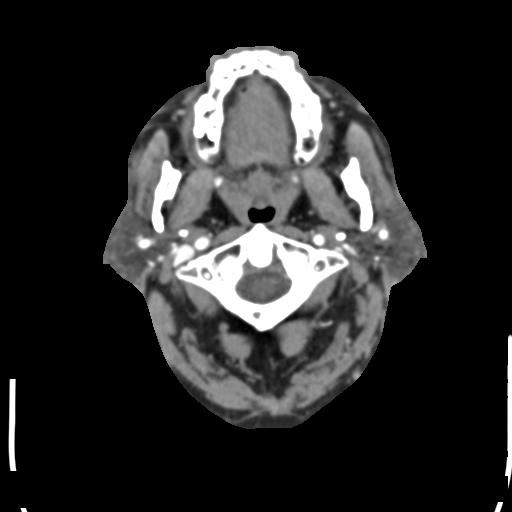
[im 222/296  soft-tissue]
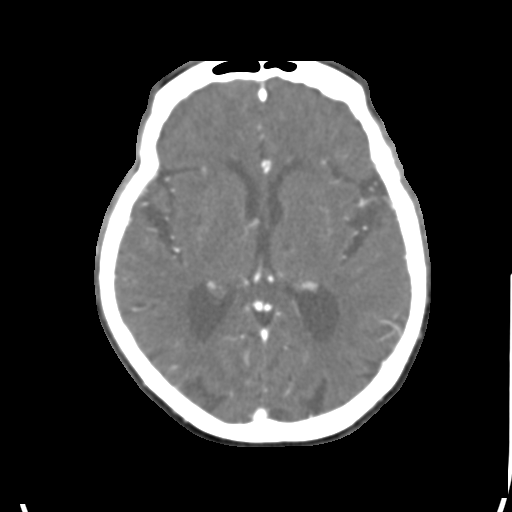

[Series 7: ax thin · axial · 0.39mm/px · z∈[+172,+344]mm · 4 of 293 slices shown]
[im 59/293  soft-tissue]
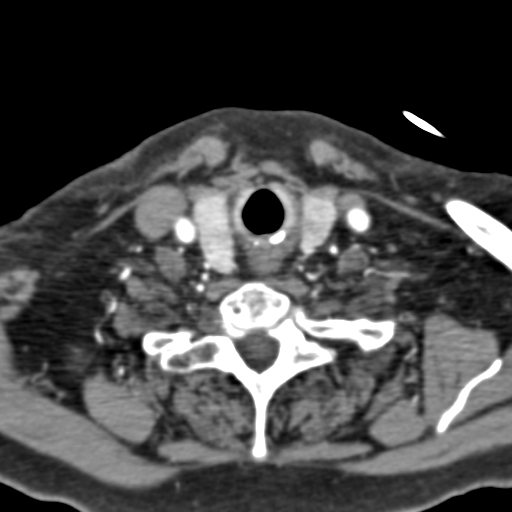
[im 117/293  bone]
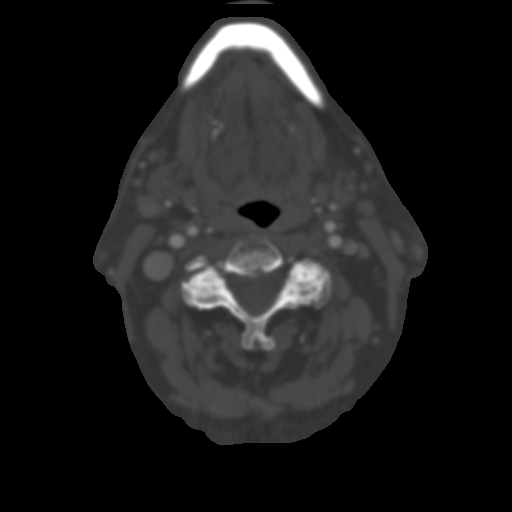
[im 176/293  soft-tissue]
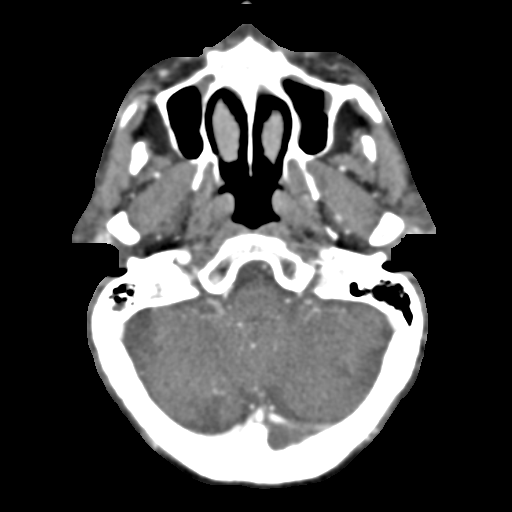
[im 234/293  bone]
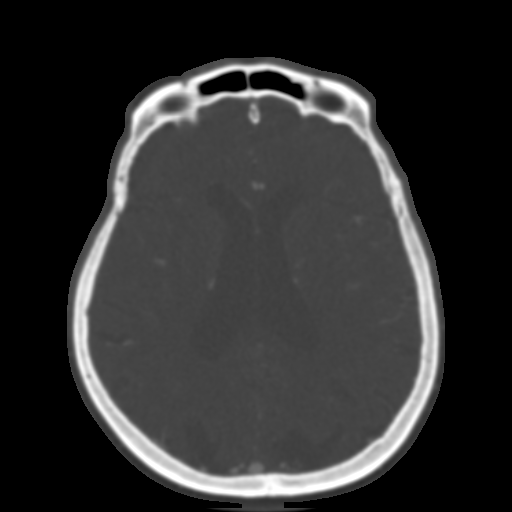

[7 of 33 positions shown; findings below may reference images not displayed]

FINDINGS: CT HEAD

Brain: Age related cerebral atrophy is again seen. Small, chronic
infarcts are present in the left cerebellum and likely bilateral
thalami. There is no evidence of acute cortical infarct,
intracranial hemorrhage, mass, midline shift, or extra-axial fluid
collection.

Calvarium and skull base: No skull fracture or destructive osseous
lesion.

Paranasal sinuses: Minimal bilateral maxillary sinus mucosal
thickening. Clear mastoid air cells.

Orbits: Prior bilateral cataract extraction.

CTA NECK

Aortic arch: 3 vessel aortic arch with mild-to-moderate calcified
plaque. No significant brachiocephalic or right subclavian artery
stenosis. There is focal densely calcified plaque in the proximal
left subclavian artery approximately 2 cm beyond its origin, likely
resulting in 75%+ stenosis and significantly progressed from the
prior CTA.

Right carotid system: Common carotid artery is patent without
stenosis. Heavily calcified eccentric plaque located along the
posterior wall of the proximal 2 cm of the internal carotid artery
results in approximately 65% stenosis, similar to the prior CTA.
There is mild irregularity of the more distal cervical ICA which is
also unchanged, without stenosis.

Left carotid system: Common carotid artery is patent without
stenosis. Densely calcified plaque in the proximal 2 cm of the ICA
results in up to 65% stenosis, similar to the prior CTA. Remainder
of the cervical ICA is unremarkable.

Vertebral arteries: The vertebral arteries are patent and small
bilaterally with the right being mildly dominant. No vertebral
artery stenosis is identified.

Skeleton: Mild reversal of the normal cervical lordosis with slight
anterolisthesis of C3 on C4 and C4 on C5. Disc degeneration greatest
at C5-6 and C6-7. Moderate to severe upper cervical facet arthrosis.

Other neck: Patulous upper thoracic esophagus. Bilateral thyroid
nodules measuring up to 1.3 cm in size, similar to the prior neck
CT. Right parotid mass on the prior neck CTA appears to have been
resected in the interim. 1.2 cm fluid density structure posterior to
the right clavicle is unchanged and benign in appearance, possibly
lymphatic in etiology.

CTA HEAD

Anterior circulation: Internal carotid arteries are patent from
skullbase to carotid termini. There is mild carotid siphon
calcification bilaterally without significant stenosis. ACAs and
MCAs are patent without significant stenosis or sizable branch
vessel occlusion. No intracranial aneurysm is identified.

Posterior circulation: The intracranial vertebral arteries are
patent and small bilaterally, with the left vertebral artery being
particularly hypoplastic and functionally terminating in PICA. Both
PICA origins are patent. The proximal basilar artery is markedly
hypoplastic, unchanged. This appears to be on a developmental basis,
as there is a persistent right-sided trigeminal artery, with the
basilar artery being larger in caliber distal to this. SCA origins
are patent. There are fetal type origins of the PCAs, with both P1
segments being markedly hypoplastic or absent. PCAs are patent
without proximal stenosis. There is a punctate calcification
involving a distal right PCA branch vessel which is unchanged and
may reflect a chronically calcified embolus.

Venous sinuses: No dural venous sinus thrombosis identified. The
transverse and sigmoid sinuses are small bilaterally.

Anatomic variants: Persistent right trigeminal artery. Fetal origins
of the PCAs.

Delayed phase: No abnormal enhancement.
IMPRESSION: 1. No evidence of acute intracranial abnormality.
2. No medium or large vessel intracranial arterial occlusion or
significant proximal stenosis. The proximal basilar artery is
markedly hypoplastic on a developmental basis related to a
persistent right trigeminal artery and fetal type origins of the
PCAs.
3. Heavily calcified plaque in both proximal internal carotid
arteries resulting in 65% stenosis.
4. 75% or greater stenosis of the proximal left subclavian artery,
progressed from 6736.

## 2017-03-26 DIAGNOSIS — H40153 Residual stage of open-angle glaucoma, bilateral: Secondary | ICD-10-CM | POA: Diagnosis not present

## 2017-04-17 DIAGNOSIS — D649 Anemia, unspecified: Secondary | ICD-10-CM | POA: Diagnosis not present

## 2017-04-17 DIAGNOSIS — E119 Type 2 diabetes mellitus without complications: Secondary | ICD-10-CM | POA: Diagnosis not present

## 2017-04-17 DIAGNOSIS — C801 Malignant (primary) neoplasm, unspecified: Secondary | ICD-10-CM | POA: Diagnosis not present

## 2017-04-17 DIAGNOSIS — K2900 Acute gastritis without bleeding: Secondary | ICD-10-CM | POA: Diagnosis not present

## 2017-04-17 DIAGNOSIS — I10 Essential (primary) hypertension: Secondary | ICD-10-CM | POA: Diagnosis not present

## 2017-04-18 DIAGNOSIS — L304 Erythema intertrigo: Secondary | ICD-10-CM | POA: Diagnosis not present

## 2017-04-24 DIAGNOSIS — E119 Type 2 diabetes mellitus without complications: Secondary | ICD-10-CM | POA: Diagnosis not present

## 2017-04-24 DIAGNOSIS — J3089 Other allergic rhinitis: Secondary | ICD-10-CM | POA: Diagnosis not present

## 2017-04-24 DIAGNOSIS — I6523 Occlusion and stenosis of bilateral carotid arteries: Secondary | ICD-10-CM | POA: Diagnosis not present

## 2017-04-24 DIAGNOSIS — I10 Essential (primary) hypertension: Secondary | ICD-10-CM | POA: Diagnosis not present

## 2017-05-10 DIAGNOSIS — Z23 Encounter for immunization: Secondary | ICD-10-CM | POA: Diagnosis not present

## 2017-07-24 DIAGNOSIS — H40153 Residual stage of open-angle glaucoma, bilateral: Secondary | ICD-10-CM | POA: Diagnosis not present

## 2017-08-09 ENCOUNTER — Ambulatory Visit (INDEPENDENT_AMBULATORY_CARE_PROVIDER_SITE_OTHER): Payer: Medicare Other | Admitting: Vascular Surgery

## 2017-08-09 ENCOUNTER — Ambulatory Visit (INDEPENDENT_AMBULATORY_CARE_PROVIDER_SITE_OTHER): Payer: Medicare Other

## 2017-08-09 ENCOUNTER — Encounter (INDEPENDENT_AMBULATORY_CARE_PROVIDER_SITE_OTHER): Payer: Self-pay | Admitting: Vascular Surgery

## 2017-08-09 VITALS — BP 119/70 | HR 63 | Resp 17 | Ht 61.5 in | Wt 148.0 lb

## 2017-08-09 DIAGNOSIS — I10 Essential (primary) hypertension: Secondary | ICD-10-CM | POA: Diagnosis not present

## 2017-08-09 DIAGNOSIS — I6523 Occlusion and stenosis of bilateral carotid arteries: Secondary | ICD-10-CM | POA: Diagnosis not present

## 2017-08-09 DIAGNOSIS — E119 Type 2 diabetes mellitus without complications: Secondary | ICD-10-CM

## 2017-08-09 DIAGNOSIS — K922 Gastrointestinal hemorrhage, unspecified: Secondary | ICD-10-CM

## 2017-08-09 DIAGNOSIS — G450 Vertebro-basilar artery syndrome: Secondary | ICD-10-CM

## 2017-08-09 NOTE — Patient Instructions (Signed)
Carotid Artery Disease The carotid arteries are arteries on both sides of the neck. They carry blood to the brain. Carotid artery disease is when the arteries get smaller (narrow) or get blocked. If these arteries get smaller or get blocked, you are more likely to have a stroke or warning stroke (transient ischemic attack). Follow these instructions at home:  Take medicines as told by your doctor. Make sure you understand all your medicine instructions. Do not stop your medicines without talking to your doctor first.  Follow your doctor's diet instructions. It is important to eat a healthy diet that includes plenty of: ? Fresh fruits. ? Vegetables. ? Lean meats.  Avoid: ? High-fat foods. ? High-sodium foods. ? Foods that are fried, overly processed, or have poor nutritional value.  Stay a healthy weight.  Stay active. Get at least 30 minutes of activity every day.  Do not smoke.  Limit alcohol use to: ? No more than 2 drinks a day for men. ? No more than 1 drink a day for women who are not pregnant.  Do not use illegal drugs.  Keep all doctor visits as told. Get help right away if:  You have sudden weakness or loss of feeling (numbness) on one side of the body, such as the face, arm, or leg.  You have sudden confusion.  You have trouble speaking (aphasia) or understanding.  You have sudden trouble seeing out of one or both eyes.  You have sudden trouble walking.  You have dizziness or feel like you might pass out (faint).  You have a loss of balance or your movements are not steady (uncoordinated).  You have a sudden, severe headache with no known cause.  You have trouble swallowing (dysphagia). Call your local emergency services (911 in [place] Do notdrive yourself to the clinic or hospital. This information is not intended to replace advice given to you by your health care provider. Make sure you discuss any questions you have with your health care  provider. Document Released: 08/13/2012 Document Revised: 02/02/2016 Document Reviewed: 02/25/2013 Elsevier Interactive Patient Education  2018 [employer]  

## 2017-08-09 NOTE — Assessment & Plan Note (Signed)
Her carotid duplex today reveals a stable 60-79% carotid artery stenosis bilaterally without significant progression from her previous study. She will continue her Plavix.  No intervention would be of benefit at this time.  Recheck in 6 months.

## 2017-08-09 NOTE — Progress Notes (Signed)
MRN : [id number]  Katie Graves is a 83 y.o. (1922/07/13) female who presents with chief complaint of  Chief Complaint  Patient presents with  . Carotid    6 month carotid u/s  .  History of Present Illness: Patient returns in follow-up of her carotid disease.  She remains vigorous and active despite her advanced age.  She reports no new changes or problems.  She denies any focal neurologic symptoms. Specifically, the patient denies amaurosis fugax, speech or swallowing difficulties, or arm or leg weakness or numbness.  Her carotid duplex today reveals a able 60-79% carotid artery stenosis bilaterally without significant progression from her previous study.  Current Outpatient Prescriptions  Medication Sig Dispense Refill  . aspirin EC 81 MG tablet Take 81 mg by mouth every morning.    Marland Kitchen atorvastatin (LIPITOR) 10 MG tablet Take 10 mg by mouth daily at 6 PM. Take on Monday, Wednesday, and Friday.    . clopidogrel (PLAVIX) 75 MG tablet Take 75 mg by mouth every morning.    . Ferrous Sulfate 140 (45 FE) MG TBCR Take 280 mg by mouth every morning.    . fexofenadine (ALLEGRA) 180 MG tablet Take 1 tablet (180 mg total) by mouth daily. 30 tablet 3  . fluticasone (FLONASE) 50 MCG/ACT nasal spray Place 2 sprays into both nostrils at bedtime.    Marland Kitchen glimepiride (AMARYL) 2 MG tablet Take 2.5 mg by mouth daily with breakfast.     . hydrALAZINE (APRESOLINE) 25 MG tablet Take 25 mg by mouth 3 (three) times daily.    Marland Kitchen ipratropium (ATROVENT) 0.03 % nasal spray Place 2 sprays into both nostrils 3 (three) times daily.    Marland Kitchen latanoprost (XALATAN) 0.005 % ophthalmic solution Place 1 drop into both eyes at bedtime.    Marland Kitchen losartan (COZAAR) 100 MG tablet Take 100 mg by mouth daily.    . verapamil (CALAN-SR) 180 MG CR tablet Take 180 mg by mouth at bedtime.    . vitamin B-12 (CYANOCOBALAMIN) 1000 MCG tablet Take 1,000 mcg by mouth daily.    . Vitamin D, Ergocalciferol, (DRISDOL) 50000  UNITS CAPS capsule Take 50,000 Units by mouth once a week.    Marland Kitchen albuterol (PROVENTIL HFA;VENTOLIN HFA) 108 (90 Base) MCG/ACT inhaler Inhale 2 puffs into the lungs every 6 (six) hours as needed for wheezing or shortness of breath. (Patient not taking: Reported on 02/01/2017) 1 Inhaler 2  . ALPRAZolam (XANAX) 0.25 MG tablet Take 0.25 mg by mouth at bedtime.    . benzonatate (TESSALON) 200 MG capsule Take 200 mg by mouth.    . cloNIDine (CATAPRES) 0.1 MG tablet Take 1 tablet (0.1 mg total) by mouth every 6 (six) hours as needed (SBP>170). (Patient not taking: Reported on 02/01/2017) 60 tablet 11  . conjugated estrogens (PREMARIN) vaginal cream Place 1 Applicatorful vaginally daily. Apply 0.5mg  (pea-sized amount)  just inside the vaginal introitus with a finger-tip every night for two weeks and then Monday, Wednesday and Friday nights. (Patient not taking: Reported on 11/05/2016) 30 g 12  . cyclobenzaprine (FLEXERIL) 5 MG tablet Take 5 mg by mouth 2 (two) times daily as needed for muscle spasms.    Marland Kitchen estradiol (ESTRACE VAGINAL) 0.1 MG/GM vaginal cream Apply 0.5mg  (pea-sized amount)  just inside the vaginal introitus with a finger-tip every night for two weeks and then Monday, Wednesday and Friday nights. (Patient not taking: Reported on 11/05/2016) 30 g 12  . famotidine (PEPCID) 20 MG tablet Take 1 tablet (20  mg total) by mouth 2 (two) times daily. (Patient not taking: Reported on 02/01/2017) 60 tablet 1  . gabapentin (NEURONTIN) 100 MG capsule Take 100 mg by mouth at bedtime.    Marland Kitchen levofloxacin (LEVAQUIN) 500 MG tablet Take 500 mg by mouth daily.    . methylPREDNISolone (MEDROL) 4 MG TBPK tablet Dispense Medrol Dosepak as directed (Patient not taking: Reported on 02/01/2017) 21 tablet 0  . ondansetron (ZOFRAN) 4 MG tablet Take 4 mg by mouth every 8 (eight) hours as needed for nausea or vomiting.    . pantoprazole (PROTONIX) 40 MG tablet Take 40 mg by mouth daily.    . Travoprost, BAK Free,  (TRAVATAN) 0.004 % SOLN ophthalmic solution 1 drop at bedtime.     No current facility-administered medications for this visit.         Past Medical History:  Diagnosis Date  . Aortic stenosis   . Atrophic vaginitis   . Back pain   . Cancer ([place])   . CVA (cerebral infarction)   . Diabetes mellitus without complication ([place])   . Gastritis   . Hypertension   . Seasonal allergies   . Stroke [facility])    TIA x3  . Vertigo          Past Surgical History:  Procedure Laterality Date  . ABDOMINAL HYSTERECTOMY    . BACK SURGERY    . BREAST BIOPSY Left    stereotactic biopsy  . COLON SURGERY    . TONSILLECTOMY      Social History     Social History  Substance Use Topics  . Smoking status: Never Smoker  . Smokeless tobacco: Never Used  . Alcohol use No  No IVDU  Family History      Family History  Problem Relation Age of Onset  . Hypertension Unknown   . Bladder Cancer Neg Hx   . Kidney disease Neg Hx   No bleeding or clotting disorders, no aneurysms, no autoimmune diseases       Allergies  Allergen Reactions  . Lidocaine Nausea Only    Other reaction(s): Unknown  . Novocain [Procaine] Nausea Only  . Penicillins     Has patient had a PCN reaction causing immediate rash, facial/tongue/throat swelling, SOB or lightheadedness with hypotension: no Has patient had a PCN reaction causing severe rash involving mucus membranes or skin necrosis: yes Has patient had a PCN reaction that required hospitalization no Has patient had a PCN reaction occurring within the last 10 years: no If all of the above answers are "NO", then may proceed with Cephalosporin use.   . Sulfa Antibiotics Nausea Only  . Vancomycin Nausea Only    Other reaction(s): Unknown  . Codeine Rash     REVIEW OF SYSTEMS (Negative unless checked)  Constitutional: [] Weight loss  [] Fever  [] Chills Cardiac: [] Chest pain   [] Chest pressure   [] Palpitations    [] Shortness of breath when laying flat   [] Shortness of breath at rest   [x] Shortness of breath with exertion. Vascular:  [] Pain in legs with walking   [] Pain in legs at rest   [] Pain in legs when laying flat   [] Claudication   [] Pain in feet when walking  [] Pain in feet at rest  [] Pain in feet when laying flat   [] History of DVT   [] Phlebitis   [] Swelling in legs   [] Varicose veins   [] Non-healing ulcers Pulmonary:   [] Uses home oxygen   [] Productive cough   [] Hemoptysis   [] Wheeze  [] COPD   []   Asthma Neurologic:  [] Dizziness  [] Blackouts   [] Seizures   [x] History of stroke   [x] History of TIA  [] Aphasia   [] Temporary blindness   [] Dysphagia   [] Weakness or numbness in arms   [] Weakness or numbness in legs Musculoskeletal:  [x] Arthritis   [] Joint swelling   [] Joint pain   [] Low back pain Hematologic:  [] Easy bruising  [] Easy bleeding   [] Hypercoagulable state   [] Anemic  [] Hepatitis Gastrointestinal:  [x] Blood in stool   [] Vomiting blood  [x] Gastroesophageal reflux/heartburn   [] Difficulty swallowing. Genitourinary:  [] Chronic kidney disease   [] Difficult urination  [] Frequent urination  [] Burning with urination   [] Blood in urine Skin:  [] Rashes   [] Ulcers   [] Wounds Psychological:  [] History of anxiety   []  History of major depression.     Physical Examination  Vitals:   08/09/17 1449 08/09/17 1450  BP: (!) 226/78 119/70  Pulse: 63 63  Resp: 17   Weight: 67.1 kg (148 lb)   Height: 5' 1.5" (1.562 m)    Body mass index is 27.51 kg/m. Gen:  WD/WN, NAD.  Appears younger than stated age Head: [place]/AT, No temporalis wasting. Ear/Nose/Throat: Hearing grossly intact, nares w/o erythema or drainage, trachea midline Eyes: Conjunctiva clear. Sclera non-icteric Neck: Supple.  Bilateral carotid bruits Pulmonary:  Good air movement, equal and clear to auscultation bilaterally.  Cardiac: RRR, no JVD Vascular:  Vessel Right Left  Radial Palpable Palpable                                    Musculoskeletal: M/S 5/5 throughout.  No deformity or atrophy.  Neurologic: CN 2-12 intact. Sensation grossly intact in extremities.  Symmetrical.  Speech is fluent. Motor exam as listed above. Psychiatric: Judgment intact, Mood & affect appropriate for pt's clinical situation. Dermatologic: No rashes or ulcers noted.  No cellulitis or open wounds.      CBC Lab Results  Component Value Date   WBC 6.6 11/05/2016   HGB 11.6 (L) 11/05/2016   HCT 33.6 (L) 11/05/2016   MCV 86.9 11/05/2016   PLT 191 11/05/2016    BMET    Component Value Date/Time   NA 131 (L) 11/05/2016 1035   NA 133 (L) 04/21/2013 1031   K 4.1 11/05/2016 1035   K 4.6 04/21/2013 1031   CL 98 (L) 11/05/2016 1035   CL 99 04/21/2013 1031   CO2 25 11/05/2016 1035   CO2 27 04/21/2013 1031   GLUCOSE 132 (H) 11/05/2016 1035   GLUCOSE 162 (H) 04/21/2013 1031   BUN 18 11/05/2016 1035   BUN 24 (H) 04/21/2013 1031   CREATININE 1.19 (H) 11/05/2016 1035   CREATININE 1.27 04/21/2013 1031   CALCIUM 8.7 (L) 11/05/2016 1035   CALCIUM 9.4 04/21/2013 1031   GFRNONAA 38 (L) 11/05/2016 1035   GFRNONAA 37 (L) 04/21/2013 1031   GFRAA 44 (L) 11/05/2016 1035   GFRAA 43 (L) 04/21/2013 1031   CrCl cannot be calculated (Patient's most recent lab result is older than the maximum 21 days allowed.).  COAG Lab Results  Component Value Date   INR 1.0 03/30/2013    Radiology No results found.    Assessment/Plan Basilar artery syndrome Medical management alone. No role for intervention  Type 2 diabetes mellitus (HCC) blood glucose control important in reducing the progression of atherosclerotic disease. Also, involved in wound healing. On appropriate medications.   Essential hypertension blood pressure control important in reducing  the progression of atherosclerotic disease. On appropriate oral medications.   GI bleed This is a chronic, slow GI bleed and I do not think that angiography would be of much benefit at  localizing the source or determining the pathology. If she is not well enough for a colonoscopy, I don't think any intervention would be warranted.   Carotid artery narrowing Her carotid duplex today reveals a stable 60-79% carotid artery stenosis bilaterally without significant progression from her previous study. She will continue her Plavix.  No intervention would be of benefit at this time.  Recheck in 6 months.    Leotis Pain, MD  08/09/2017 3:19 PM    This note was created with Dragon medical transcription system.  Any errors from dictation are purely unintentional

## 2017-11-21 DIAGNOSIS — H40153 Residual stage of open-angle glaucoma, bilateral: Secondary | ICD-10-CM | POA: Diagnosis not present

## 2018-02-07 ENCOUNTER — Ambulatory Visit (INDEPENDENT_AMBULATORY_CARE_PROVIDER_SITE_OTHER): Payer: Medicare Other | Admitting: Vascular Surgery

## 2018-02-07 ENCOUNTER — Ambulatory Visit (INDEPENDENT_AMBULATORY_CARE_PROVIDER_SITE_OTHER): Payer: PRIVATE HEALTH INSURANCE

## 2018-02-07 DIAGNOSIS — I6523 Occlusion and stenosis of bilateral carotid arteries: Secondary | ICD-10-CM | POA: Diagnosis not present

## 2018-11-05 ENCOUNTER — Telehealth (INDEPENDENT_AMBULATORY_CARE_PROVIDER_SITE_OTHER): Payer: Self-pay | Admitting: Vascular Surgery

## 2018-11-05 NOTE — Telephone Encounter (Signed)
Patient last seen in November of 2018. Dew suggested she have Carotid US in 6 months. Patient did have [place] in May 2019. Per patient she was never notified of her results.  Spoke with [place]. He suggested that patient comes in to be seen and also has US Carotid Duplex then sees Dr. Lucky Cowboy.  Can you please call the patient to schedule this apt? AS, CMA

## 2018-11-05 NOTE — Telephone Encounter (Signed)
Pt is scheduled in March

## 2018-11-25 ENCOUNTER — Encounter (INDEPENDENT_AMBULATORY_CARE_PROVIDER_SITE_OTHER): Payer: Medicare HMO

## 2018-11-25 ENCOUNTER — Ambulatory Visit (INDEPENDENT_AMBULATORY_CARE_PROVIDER_SITE_OTHER): Payer: Medicare HMO | Admitting: Nurse Practitioner

## 2018-12-10 ENCOUNTER — Encounter (INDEPENDENT_AMBULATORY_CARE_PROVIDER_SITE_OTHER): Payer: Self-pay

## 2018-12-10 ENCOUNTER — Ambulatory Visit (INDEPENDENT_AMBULATORY_CARE_PROVIDER_SITE_OTHER): Payer: Medicare HMO | Admitting: Nurse Practitioner

## 2018-12-10 ENCOUNTER — Encounter (INDEPENDENT_AMBULATORY_CARE_PROVIDER_SITE_OTHER): Payer: Medicare HMO

## 2019-05-29 ENCOUNTER — Other Ambulatory Visit: Payer: Self-pay | Admitting: Internal Medicine

## 2019-05-29 DIAGNOSIS — D649 Anemia, unspecified: Secondary | ICD-10-CM

## 2019-05-29 DIAGNOSIS — I350 Nonrheumatic aortic (valve) stenosis: Secondary | ICD-10-CM

## 2019-05-29 DIAGNOSIS — R413 Other amnesia: Secondary | ICD-10-CM

## 2019-06-10 ENCOUNTER — Other Ambulatory Visit: Payer: Self-pay

## 2019-06-10 ENCOUNTER — Ambulatory Visit
Admission: RE | Admit: 2019-06-10 | Discharge: 2019-06-10 | Disposition: A | Discharge status: Home / Self Care | Payer: Medicare HMO | Source: Ambulatory Visit | Attending: Internal Medicine | Admitting: Internal Medicine

## 2019-06-10 DIAGNOSIS — D649 Anemia, unspecified: Secondary | ICD-10-CM | POA: Diagnosis present

## 2019-06-10 DIAGNOSIS — I350 Nonrheumatic aortic (valve) stenosis: Secondary | ICD-10-CM | POA: Insufficient documentation

## 2019-06-10 DIAGNOSIS — R413 Other amnesia: Secondary | ICD-10-CM | POA: Insufficient documentation

## 2020-11-27 ENCOUNTER — Emergency Department: Payer: Medicare HMO

## 2020-11-27 ENCOUNTER — Inpatient Hospital Stay: Payer: Medicare HMO

## 2020-11-27 ENCOUNTER — Other Ambulatory Visit: Payer: Self-pay

## 2020-11-27 ENCOUNTER — Inpatient Hospital Stay
Admission: EM | Admit: 2020-11-27 | Discharge: 2020-12-04 | DRG: 377 | Disposition: A | Discharge status: Hospice | Payer: Medicare HMO | Attending: Internal Medicine | Admitting: Internal Medicine

## 2020-11-27 DIAGNOSIS — E872 Acidosis: Secondary | ICD-10-CM | POA: Diagnosis present

## 2020-11-27 DIAGNOSIS — K922 Gastrointestinal hemorrhage, unspecified: Secondary | ICD-10-CM | POA: Diagnosis present

## 2020-11-27 DIAGNOSIS — Z7982 Long term (current) use of aspirin: Secondary | ICD-10-CM

## 2020-11-27 DIAGNOSIS — I214 Non-ST elevation (NSTEMI) myocardial infarction: Secondary | ICD-10-CM | POA: Diagnosis present

## 2020-11-27 DIAGNOSIS — Z888 Allergy status to other drugs, medicaments and biological substances status: Secondary | ICD-10-CM

## 2020-11-27 DIAGNOSIS — M6282 Rhabdomyolysis: Secondary | ICD-10-CM | POA: Diagnosis present

## 2020-11-27 DIAGNOSIS — Z85038 Personal history of other malignant neoplasm of large intestine: Secondary | ICD-10-CM

## 2020-11-27 DIAGNOSIS — R578 Other shock: Secondary | ICD-10-CM | POA: Diagnosis present

## 2020-11-27 DIAGNOSIS — M549 Dorsalgia, unspecified: Secondary | ICD-10-CM

## 2020-11-27 DIAGNOSIS — K7200 Acute and subacute hepatic failure without coma: Secondary | ICD-10-CM | POA: Diagnosis present

## 2020-11-27 DIAGNOSIS — Z515 Encounter for palliative care: Secondary | ICD-10-CM | POA: Diagnosis not present

## 2020-11-27 DIAGNOSIS — Z20822 Contact with and (suspected) exposure to covid-19: Secondary | ICD-10-CM | POA: Diagnosis present

## 2020-11-27 DIAGNOSIS — Z880 Allergy status to penicillin: Secondary | ICD-10-CM

## 2020-11-27 DIAGNOSIS — Z7189 Other specified counseling: Secondary | ICD-10-CM | POA: Diagnosis not present

## 2020-11-27 DIAGNOSIS — D62 Acute posthemorrhagic anemia: Secondary | ICD-10-CM | POA: Diagnosis present

## 2020-11-27 DIAGNOSIS — Z885 Allergy status to narcotic agent status: Secondary | ICD-10-CM

## 2020-11-27 DIAGNOSIS — Z7902 Long term (current) use of antithrombotics/antiplatelets: Secondary | ICD-10-CM

## 2020-11-27 DIAGNOSIS — I129 Hypertensive chronic kidney disease with stage 1 through stage 4 chronic kidney disease, or unspecified chronic kidney disease: Secondary | ICD-10-CM | POA: Diagnosis present

## 2020-11-27 DIAGNOSIS — Z8673 Personal history of transient ischemic attack (TIA), and cerebral infarction without residual deficits: Secondary | ICD-10-CM | POA: Diagnosis not present

## 2020-11-27 DIAGNOSIS — G450 Vertebro-basilar artery syndrome: Secondary | ICD-10-CM | POA: Diagnosis present

## 2020-11-27 DIAGNOSIS — N179 Acute kidney failure, unspecified: Secondary | ICD-10-CM | POA: Diagnosis present

## 2020-11-27 DIAGNOSIS — I998 Other disorder of circulatory system: Secondary | ICD-10-CM | POA: Diagnosis present

## 2020-11-27 DIAGNOSIS — E1122 Type 2 diabetes mellitus with diabetic chronic kidney disease: Secondary | ICD-10-CM | POA: Diagnosis present

## 2020-11-27 DIAGNOSIS — Z681 Body mass index (BMI) 19 or less, adult: Secondary | ICD-10-CM

## 2020-11-27 DIAGNOSIS — I350 Nonrheumatic aortic (valve) stenosis: Secondary | ICD-10-CM | POA: Diagnosis present

## 2020-11-27 DIAGNOSIS — N1832 Chronic kidney disease, stage 3b: Secondary | ICD-10-CM | POA: Diagnosis present

## 2020-11-27 DIAGNOSIS — E46 Unspecified protein-calorie malnutrition: Secondary | ICD-10-CM | POA: Diagnosis present

## 2020-11-27 DIAGNOSIS — H903 Sensorineural hearing loss, bilateral: Secondary | ICD-10-CM | POA: Diagnosis present

## 2020-11-27 DIAGNOSIS — G928 Other toxic encephalopathy: Secondary | ICD-10-CM | POA: Diagnosis present

## 2020-11-27 DIAGNOSIS — Z7984 Long term (current) use of oral hypoglycemic drugs: Secondary | ICD-10-CM

## 2020-11-27 DIAGNOSIS — R262 Difficulty in walking, not elsewhere classified: Secondary | ICD-10-CM | POA: Diagnosis present

## 2020-11-27 DIAGNOSIS — Z8249 Family history of ischemic heart disease and other diseases of the circulatory system: Secondary | ICD-10-CM

## 2020-11-27 DIAGNOSIS — R410 Disorientation, unspecified: Secondary | ICD-10-CM | POA: Diagnosis not present

## 2020-11-27 DIAGNOSIS — R54 Age-related physical debility: Secondary | ICD-10-CM | POA: Diagnosis present

## 2020-11-27 DIAGNOSIS — Z79899 Other long term (current) drug therapy: Secondary | ICD-10-CM

## 2020-11-27 DIAGNOSIS — Z882 Allergy status to sulfonamides status: Secondary | ICD-10-CM

## 2020-11-27 DIAGNOSIS — Z66 Do not resuscitate: Secondary | ICD-10-CM | POA: Diagnosis present

## 2020-11-27 LAB — CBC WITH DIFFERENTIAL/PLATELET
Abs Immature Granulocytes: 0.2 10*3/uL — ABNORMAL HIGH (ref 0.00–0.07)
Basophils Absolute: 0 10*3/uL (ref 0.0–0.1)
Basophils Relative: 0 %
Eosinophils Absolute: 0 10*3/uL (ref 0.0–0.5)
Eosinophils Relative: 0 %
HCT: 16 % — ABNORMAL LOW (ref 36.0–46.0)
Hemoglobin: 4.8 g/dL — CL (ref 12.0–15.0)
Immature Granulocytes: 1 %
Lymphocytes Relative: 5 %
Lymphs Abs: 0.9 10*3/uL (ref 0.7–4.0)
MCH: 23.9 pg — ABNORMAL LOW (ref 26.0–34.0)
MCHC: 30 g/dL (ref 30.0–36.0)
MCV: 79.6 fL — ABNORMAL LOW (ref 80.0–100.0)
Monocytes Absolute: 1.3 10*3/uL — ABNORMAL HIGH (ref 0.1–1.0)
Monocytes Relative: 7 %
Neutro Abs: 16.9 10*3/uL — ABNORMAL HIGH (ref 1.7–7.7)
Neutrophils Relative %: 87 %
Platelets: 277 10*3/uL (ref 150–400)
RBC: 2.01 MIL/uL — ABNORMAL LOW (ref 3.87–5.11)
RDW: 16 % — ABNORMAL HIGH (ref 11.5–15.5)
WBC: 19.3 10*3/uL — ABNORMAL HIGH (ref 4.0–10.5)
nRBC: 0 % (ref 0.0–0.2)

## 2020-11-27 LAB — COMPREHENSIVE METABOLIC PANEL
ALT: 195 U/L — ABNORMAL HIGH (ref 0–44)
AST: 499 U/L — ABNORMAL HIGH (ref 15–41)
Albumin: 3.1 g/dL — ABNORMAL LOW (ref 3.5–5.0)
Alkaline Phosphatase: 70 U/L (ref 38–126)
Anion gap: 25 — ABNORMAL HIGH (ref 5–15)
BUN: 108 mg/dL — ABNORMAL HIGH (ref 8–23)
CO2: 11 mmol/L — ABNORMAL LOW (ref 22–32)
Calcium: 9.1 mg/dL (ref 8.9–10.3)
Chloride: 103 mmol/L (ref 98–111)
Creatinine, Ser: 3.4 mg/dL — ABNORMAL HIGH (ref 0.44–1.00)
GFR, Estimated: 12 mL/min — ABNORMAL LOW (ref >60)
Glucose, Bld: 155 mg/dL — ABNORMAL HIGH (ref 70–99)
Potassium: 4.3 mmol/L (ref 3.5–5.1)
Sodium: 139 mmol/L (ref 135–145)
Total Bilirubin: 0.7 mg/dL (ref 0.3–1.2)
Total Protein: 5.6 g/dL — ABNORMAL LOW (ref 6.5–8.1)

## 2020-11-27 LAB — RESP PANEL BY RT-PCR (FLU A&B, COVID) ARPGX2
Influenza A by PCR: NEGATIVE
Influenza B by PCR: NEGATIVE
SARS Coronavirus 2 by RT PCR: NEGATIVE

## 2020-11-27 LAB — MRSA PCR SCREENING: MRSA by PCR: NEGATIVE

## 2020-11-27 LAB — TROPONIN I (HIGH SENSITIVITY)
Troponin I (High Sensitivity): 24992 ng/L (ref <18)
Troponin I (High Sensitivity): >27000 ng/L (ref <18)

## 2020-11-27 LAB — GLUCOSE, CAPILLARY: Glucose-Capillary: 235 mg/dL — ABNORMAL HIGH (ref 70–99)

## 2020-11-27 LAB — HEMOGLOBIN AND HEMATOCRIT, BLOOD
HCT: 38.3 % (ref 36.0–46.0)
Hemoglobin: 13.1 g/dL (ref 12.0–15.0)

## 2020-11-27 LAB — PREPARE RBC (CROSSMATCH)

## 2020-11-27 LAB — LACTIC ACID, PLASMA
Lactic Acid, Venous: >11 mmol/L (ref 0.5–1.9)
Lactic Acid, Venous: 6 mmol/L (ref 0.5–1.9)

## 2020-11-27 LAB — ABO/RH: ABO/RH(D): O POS

## 2020-11-27 MED ORDER — SODIUM CHLORIDE 0.9 % IV SOLN
10.0000 mL/h | Freq: Once | INTRAVENOUS | Status: AC
  Administered 2020-11-27: 10 mL/h via INTRAVENOUS

## 2020-11-27 MED ORDER — ORAL CARE MOUTH RINSE
15.0000 mL | Freq: Two times a day (BID) | OROMUCOSAL | Status: DC
  Administered 2020-11-27 – 2020-12-04 (×14): 15 mL via OROMUCOSAL

## 2020-11-27 MED ORDER — NOREPINEPHRINE 4 MG/250ML-% IV SOLN
2.0000 ug/min | INTRAVENOUS | Status: DC
  Administered 2020-11-27: 2 ug/min via INTRAVENOUS
  Filled 2020-11-27: qty 250

## 2020-11-27 MED ORDER — SODIUM CHLORIDE 0.9% IV SOLUTION
Freq: Once | INTRAVENOUS | Status: AC
  Filled 2020-11-27: qty 250

## 2020-11-27 MED ORDER — FAMOTIDINE IN NACL 20-0.9 MG/50ML-% IV SOLN
20.0000 mg | INTRAVENOUS | Status: DC
  Administered 2020-11-27: 20 mg via INTRAVENOUS

## 2020-11-27 MED ORDER — PANTOPRAZOLE SODIUM 40 MG IV SOLR
40.0000 mg | Freq: Once | INTRAVENOUS | Status: AC
  Administered 2020-11-27: 40 mg via INTRAVENOUS
  Filled 2020-11-27: qty 40

## 2020-11-27 MED ORDER — LACTATED RINGERS IV BOLUS
1000.0000 mL | Freq: Once | INTRAVENOUS | Status: AC
Start: 2020-11-27 — End: 2020-11-27
  Administered 2020-11-27: 1000 mL via INTRAVENOUS

## 2020-11-27 MED ORDER — SODIUM CHLORIDE 0.9 % IV SOLN: INTRAVENOUS | Status: AC

## 2020-11-27 MED ORDER — SODIUM CHLORIDE 0.9 % IV SOLN
250.0000 mL | INTRAVENOUS | Status: DC
  Administered 2020-11-27 – 2020-11-29 (×2): 250 mL via INTRAVENOUS

## 2020-11-27 NOTE — Consult Note (Addendum)
[facility] for Electrolyte Monitoring and Replacement   Recent Labs: Potassium (mmol/L)  Date Value  11/27/2020 4.3  04/21/2013 4.6   Calcium (mg/dL)  Date Value  11/27/2020 9.1   Calcium, Total (mg/dL)  Date Value  04/21/2013 9.4   Albumin (g/dL)  Date Value  11/27/2020 3.1 (L)  03/30/2013 3.8   Sodium (mmol/L)  Date Value  11/27/2020 139  04/21/2013 133 (L)    Assessment: Patient is a 87 y/o F with medical history including hearing loss, HTN, diabetes, MDD, history of CVA who presented to the ED 3/20 with falls / weakness and history of melenotic stools. Patient admitted with hemorrhagic shock secondary to suspected GIB and acute renal failure. Labs on admission significant for troponin 24992, lactate > 11, Scr 3.4, AST 499, ALT 195, Hgb 4.8. Pharmacy has been consulted to assist with electrolyte monitoring and replacement as indicated.   Goal of Therapy:  Electrolytes within normal limits  Plan:  3/20 admission labs: Na 139, K 4.3, Scr 3.4 --No electrolyte replacement warranted at this time --Will follow-up electrolytes with AM labs tomorrow  [name] 11/27/2020 3:23 PM

## 2020-11-27 NOTE — Consult Note (Signed)
Consultation  Referring Provider:    ED Admit date: 3/20 Consult date: 3/20         Reason for Consultation:  Hx of melena/anemia            HPI:   Katie Graves is a 87 y.o. lady with PMH of colon cancer with right hemicolectomy in 1987, [place], CVA with basilar artery stenosis, and hypertension who is here with findings of shock. Patient is not oriented so history given by daughter. States that earlier this past week she has been complaining of fatigue and then on Friday she had a fall and subsequently Friday night had 3-4 episodes of black tarry stool and has not had any stool since then. Yesterday she was very lethargic and complained of severe back pain and some abdominal pain when they were trying to move her. She was brought in by her family due her change in clinical status. Her last dose of plavix was yesterday. She no longer takes a PPI. She had some ibuprofen Friday evening but does not take them regularly. Family notes dysphagia for years with regurgitation that has been worse the past few years. Her last EGD/Colon was 20 years ago and was unremarkable. Had non-contrast CT scan in 2018 as she had fecal occult blood positive at that time and procedures were deferred due to age. Had systolics in the 82'N on presentation but has been responding appropriately to resuscitation as MAPS in 70's to 80's on levophed now so can likely be weaned down. Family states she can't be sedated due to basilar artery stenosis per physician at [facility]. There is no biochemical or imaging findings that suggest cirrhosis.  Past Medical History:  Diagnosis Date  . Aortic stenosis   . Atrophic vaginitis   . Back pain   . Cancer ([place])   . CVA (cerebral infarction)   . Diabetes mellitus without complication ([place])   . Gastritis   . Hypertension   . Seasonal allergies   . Stroke [facility])    TIA x3  . Vertigo     Past Surgical History:  Procedure Laterality Date  . ABDOMINAL HYSTERECTOMY    . BACK SURGERY    .  BREAST BIOPSY Left    stereotactic biopsy  . COLON SURGERY    . TONSILLECTOMY      Family History  Problem Relation Age of Onset  . Hypertension Other   . Bladder Cancer Neg Hx   . Kidney disease Neg Hx      Social History   Tobacco Use  . Smoking status: Never Smoker  . Smokeless tobacco: Never Used  Substance Use Topics  . Alcohol use: No    Alcohol/week: 0.0 standard drinks  . Drug use: No    Prior to Admission medications   Medication Sig Start Date End Date Taking? Authorizing Provider  albuterol (PROVENTIL HFA;VENTOLIN HFA) 108 (90 Base) MCG/ACT inhaler Inhale 2 puffs into the lungs every 6 (six) hours as needed for wheezing or shortness of breath. Patient not taking: Reported on 02/01/2017 11/05/16   Earleen Newport, MD  ALPRAZolam Duanne Moron) 0.25 MG tablet Take 0.25 mg by mouth at bedtime.    [provider]  aspirin EC 81 MG tablet Take 81 mg by mouth every morning.    [provider]  atorvastatin (LIPITOR) 10 MG tablet Take 10 mg by mouth daily at 6 PM. Take on Monday, Wednesday, and Friday. 04/19/14   [provider]  cloNIDine (CATAPRES) 0.1 MG tablet  Take 1 tablet (0.1 mg total) by mouth every 6 (six) hours as needed (SBP>170). Patient not taking: Reported on 02/01/2017 06/21/15   Tracie Harrier, MD  clopidogrel (PLAVIX) 75 MG tablet Take 75 mg by mouth every morning. 05/26/15   [provider]  conjugated estrogens (PREMARIN) vaginal cream Place 1 Applicatorful vaginally daily. Apply 0.5mg  (pea-sized amount)  just inside the vaginal introitus with a finger-tip every night for two weeks and then Monday, Wednesday and Friday nights. Patient not taking: Reported on 11/05/2016 12/12/15   Zara Council A, PA-C  cyclobenzaprine (FLEXERIL) 5 MG tablet Take 5 mg by mouth 2 (two) times daily as needed for muscle spasms.    [provider]  estradiol (ESTRACE VAGINAL) 0.1 MG/GM vaginal cream Apply 0.5mg  (pea-sized amount)  just  inside the vaginal introitus with a finger-tip every night for two weeks and then Monday, Wednesday and Friday nights. Patient not taking: Reported on 11/05/2016 12/12/15   Zara Council A, PA-C  famotidine (PEPCID) 20 MG tablet Take 1 tablet (20 mg total) by mouth 2 (two) times daily. Patient not taking: Reported on 02/01/2017 11/05/16   Earleen Newport, MD  Ferrous Sulfate 140 (45 FE) MG TBCR Take 280 mg by mouth every morning.    [provider]  fexofenadine (ALLEGRA) 180 MG tablet Take 1 tablet (180 mg total) by mouth daily. 06/21/15   Tracie Harrier, MD  fluticasone (FLONASE) 50 MCG/ACT nasal spray Place 2 sprays into both nostrils at bedtime. 05/17/15   [provider]  gabapentin (NEURONTIN) 100 MG capsule Take 100 mg by mouth at bedtime.    [provider]  glimepiride (AMARYL) 2 MG tablet Take 2.5 mg by mouth daily with breakfast.  06/03/15   [provider]  hydrALAZINE (APRESOLINE) 25 MG tablet Take 25 mg by mouth 3 (three) times daily. 06/07/15   [provider]  ipratropium (ATROVENT) 0.03 % nasal spray Place 2 sprays into both nostrils 3 (three) times daily.    [provider]  latanoprost (XALATAN) 0.005 % ophthalmic solution Place 1 drop into both eyes at bedtime.    [provider]  losartan (COZAAR) 100 MG tablet Take 100 mg by mouth daily. 06/03/15   [provider]  pantoprazole (PROTONIX) 40 MG tablet Take 40 mg by mouth daily.    [provider]  Travoprost, BAK Free, (TRAVATAN) 0.004 % SOLN ophthalmic solution 1 drop at bedtime.    [provider]  verapamil (CALAN-SR) 180 MG CR tablet Take 180 mg by mouth at bedtime. 06/03/15   [provider]  vitamin B-12 (CYANOCOBALAMIN) 1000 MCG tablet Take 1,000 mcg by mouth daily. 06/06/15   [provider]  vitamin C (ASCORBIC ACID) 250 MG tablet Take by mouth.    [provider]  Vitamin D, Ergocalciferol, (DRISDOL)  50000 UNITS CAPS capsule Take 50,000 Units by mouth once a week. 05/26/15   [provider]    Current Facility-Administered Medications  Medication Dose Route Frequency Provider Last Rate Last Admin  . 0.9 %  sodium chloride infusion  250 mL Intravenous Continuous Blake Divine, MD   Stopped at 11/27/20 1534  . famotidine (PEPCID) IVPB 20 mg premix  20 mg Intravenous Campbell Riches, Fuad, MD      . norepinephrine (LEVOPHED) 4mg  in 241mL premix infusion  2-10 mcg/min Intravenous Titrated Blake Divine, MD 37.5 mL/hr at 11/27/20 1338 10 mcg/min at 11/27/20 1338   Current Outpatient Medications  Medication Sig Dispense Refill  .  albuterol (PROVENTIL HFA;VENTOLIN HFA) 108 (90 Base) MCG/ACT inhaler Inhale 2 puffs into the lungs every 6 (six) hours as needed for wheezing or shortness of breath. (Patient not taking: Reported on 02/01/2017) 1 Inhaler 2  . ALPRAZolam (XANAX) 0.25 MG tablet Take 0.25 mg by mouth at bedtime.    Marland Kitchen aspirin EC 81 MG tablet Take 81 mg by mouth every morning.    Marland Kitchen atorvastatin (LIPITOR) 10 MG tablet Take 10 mg by mouth daily at 6 PM. Take on Monday, Wednesday, and Friday.    . cloNIDine (CATAPRES) 0.1 MG tablet Take 1 tablet (0.1 mg total) by mouth every 6 (six) hours as needed (SBP>170). (Patient not taking: Reported on 02/01/2017) 60 tablet 11  . clopidogrel (PLAVIX) 75 MG tablet Take 75 mg by mouth every morning.    . conjugated estrogens (PREMARIN) vaginal cream Place 1 Applicatorful vaginally daily. Apply 0.5mg  (pea-sized amount)  just inside the vaginal introitus with a finger-tip every night for two weeks and then Monday, Wednesday and Friday nights. (Patient not taking: Reported on 11/05/2016) 30 g 12  . cyclobenzaprine (FLEXERIL) 5 MG tablet Take 5 mg by mouth 2 (two) times daily as needed for muscle spasms.    Marland Kitchen estradiol (ESTRACE VAGINAL) 0.1 MG/GM vaginal cream Apply 0.5mg  (pea-sized amount)  just inside the vaginal introitus with a finger-tip every night  for two weeks and then Monday, Wednesday and Friday nights. (Patient not taking: Reported on 11/05/2016) 30 g 12  . famotidine (PEPCID) 20 MG tablet Take 1 tablet (20 mg total) by mouth 2 (two) times daily. (Patient not taking: Reported on 02/01/2017) 60 tablet 1  . Ferrous Sulfate 140 (45 FE) MG TBCR Take 280 mg by mouth every morning.    . fexofenadine (ALLEGRA) 180 MG tablet Take 1 tablet (180 mg total) by mouth daily. 30 tablet 3  . fluticasone (FLONASE) 50 MCG/ACT nasal spray Place 2 sprays into both nostrils at bedtime.    . gabapentin (NEURONTIN) 100 MG capsule Take 100 mg by mouth at bedtime.    Marland Kitchen glimepiride (AMARYL) 2 MG tablet Take 2.5 mg by mouth daily with breakfast.     . hydrALAZINE (APRESOLINE) 25 MG tablet Take 25 mg by mouth 3 (three) times daily.    Marland Kitchen ipratropium (ATROVENT) 0.03 % nasal spray Place 2 sprays into both nostrils 3 (three) times daily.    Marland Kitchen latanoprost (XALATAN) 0.005 % ophthalmic solution Place 1 drop into both eyes at bedtime.    Marland Kitchen losartan (COZAAR) 100 MG tablet Take 100 mg by mouth daily.    . pantoprazole (PROTONIX) 40 MG tablet Take 40 mg by mouth daily.    . Travoprost, BAK Free, (TRAVATAN) 0.004 % SOLN ophthalmic solution 1 drop at bedtime.    . verapamil (CALAN-SR) 180 MG CR tablet Take 180 mg by mouth at bedtime.    . vitamin B-12 (CYANOCOBALAMIN) 1000 MCG tablet Take 1,000 mcg by mouth daily.    . vitamin C (ASCORBIC ACID) 250 MG tablet Take by mouth.    . Vitamin D, Ergocalciferol, (DRISDOL) 50000 UNITS CAPS capsule Take 50,000 Units by mouth once a week.      Allergies as of 11/27/2020 - Review Complete 11/27/2020  Allergen Reaction Noted  . Lidocaine Nausea Only 11/09/2015  . Novocain [procaine] Nausea Only 06/19/2015  . Penicillins  06/19/2015  . Sulfa antibiotics Nausea Only 06/19/2015  . Vancomycin Nausea Only 11/09/2015  . Codeine Rash 11/09/2015     Review of Systems:  All systems reviewed and negative except where noted in  HPI.  Unable to assess due to mental status   Physical Exam:  Vital signs in last 24 hours: Temp:  [98 F (36.7 C)] 98 F (36.7 C) (03/20 1345) Pulse Rate:  [54-92] 76 (03/20 1541) Resp:  [15-21] 20 (03/20 1541) BP: (37-133)/(14-83) 105/71 (03/20 1541) SpO2:  [93 %-100 %] 96 % (03/20 1541) Weight:  [60 kg] 60 kg (03/20 1228)   General:   Appears disoriented Head:  Normocephalic and atraumatic. Eyes:   No icterus.    Neck:  Supple Lungs:  No respiratory distress Abdomen:   Flat, soft, nondistended, nontender Msk:  MAEW x4, No clubbing or cyanosis Neurologic:  disoriented Skin:  Warm, dry, pink without significant lesions or rashes.  LAB RESULTS: Recent Labs    11/27/20 1236  WBC 19.3*  HGB 4.8*  HCT 16.0*  PLT 277   BMET Recent Labs    11/27/20 1236  NA 139  K 4.3  CL 103  CO2 11*  GLUCOSE 155*  BUN 108*  CREATININE 3.40*  CALCIUM 9.1   LFT Recent Labs    11/27/20 1236  PROT 5.6*  ALBUMIN 3.1*  AST 499*  ALT 195*  ALKPHOS 70  BILITOT 0.7   PT/INR No results for input(s): LABPROT, INR in the last 72 hours.  STUDIES: DG Chest Portable 1 View  Result Date: 11/27/2020 CLINICAL DATA:  Multiple falls and weakness for 2 days. EXAM: PORTABLE CHEST 1 VIEW COMPARISON:  Chest radiograph dated 11/05/2016. FINDINGS: The heart size is normal. Vascular calcifications are seen in the aortic arch. Both lungs are clear. Degenerative changes are seen in the spine. IMPRESSION: No active disease. Electronically Signed   By: Zerita Boers M.D.   On: 11/27/2020 13:41       Impression / Plan:   87 y/o lady with PMH of colon cancer with right hemicolectomy in 1987, [place], CVA with basilar artery stenosis, and hypertension who is here with findings of shock and reported history of melenic stool almost two days ago. I am not entirely convinced this is shock due to GI bleeding as only 3 episodes of melena two days ago. Other etiologies will need to be evaluated. Fortunately  patient is responding appropriately to resuscitation.  - two large bore IV's - transfuse pRBC's to keep hemoglobin > 7 - IV PPI BID - hold plavix/aspirin - IV fluids - trend labs - recommend CT chest/abdomen/pelvis (non-con) given fall history and significant back pain that started afterwards - sepsis work-up - monitor for any signs of recurrent GI bleeding  Will continue to follow closely. Please call with any questions or concerns.  Raylene Miyamoto MD, MPH [place]

## 2020-11-27 NOTE — ED Provider Notes (Signed)
[facility] Emergency Department Provider Note   ____________________________________________   Event Date/Time   First MD Initiated Contact with Patient 11/27/20 1227     (approximate)  I have reviewed the triage vital signs and the nursing notes.   HISTORY  Chief Complaint Fall (X 2 days falls, lower back pain )    HPI Katie Graves is a 87 y.o. female with past medical history of hypertension, diabetes, and stroke who presents to the ED for weakness.  History is limited due to patient's disorientation and severe weakness.  Per EMS, she has been increasingly weak for the past couple of days at home with a couple of falls.  Family was unable to get her up out of bed today and so they called EMS.  Speaking over the phone, daughter states that she has had dark bowel movements recently, which they were not sure if it could be blood.  Patient is somnolent but arousable to voice, shakes her head "no" when asked if she has having any pain or difficulty breathing.        Past Medical History:  Diagnosis Date  . Aortic stenosis   . Atrophic vaginitis   . Back pain   . Cancer ([place])   . CVA (cerebral infarction)   . Diabetes mellitus without complication ([place])   . Gastritis   . Hypertension   . Seasonal allergies   . Stroke [facility])    TIA x3  . Vertigo     Patient Active Problem List   Diagnosis Date Noted  . Acute blood loss anemia (ABLA) 11/27/2020  . GI bleed 02/01/2017  . Essential hypertension 07/27/2016  . Basilar artery syndrome 07/27/2016  . Atrophic vaginitis 12/12/2015  . Aortic heart valve narrowing 11/08/2015  . Malignant neoplastic disease ([place]) 11/08/2015  . Type 2 diabetes mellitus ([place]) 11/08/2015  . Cerebrovascular accident (CVA) ([place]) 11/08/2015  . Accelerated hypertension 06/19/2015  . Carotid artery narrowing 09/06/2014    Past Surgical History:  Procedure Laterality Date  . ABDOMINAL HYSTERECTOMY    . BACK SURGERY    .  BREAST BIOPSY Left    stereotactic biopsy  . COLON SURGERY    . TONSILLECTOMY      Prior to Admission medications   Medication Sig Start Date End Date Taking? Authorizing Provider  albuterol (PROVENTIL HFA;VENTOLIN HFA) 108 (90 Base) MCG/ACT inhaler Inhale 2 puffs into the lungs every 6 (six) hours as needed for wheezing or shortness of breath. Patient not taking: Reported on 02/01/2017 11/05/16   Earleen Newport, MD  ALPRAZolam Duanne Moron) 0.25 MG tablet Take 0.25 mg by mouth at bedtime.    [provider]  aspirin EC 81 MG tablet Take 81 mg by mouth every morning.    [provider]  atorvastatin (LIPITOR) 10 MG tablet Take 10 mg by mouth daily at 6 PM. Take on Monday, Wednesday, and Friday. 04/19/14   [provider]  cloNIDine (CATAPRES) 0.1 MG tablet Take 1 tablet (0.1 mg total) by mouth every 6 (six) hours as needed (SBP>170). Patient not taking: Reported on 02/01/2017 06/21/15   Tracie Harrier, MD  clopidogrel (PLAVIX) 75 MG tablet Take 75 mg by mouth every morning. 05/26/15   [provider]  conjugated estrogens (PREMARIN) vaginal cream Place 1 Applicatorful vaginally daily. Apply 0.5mg  (pea-sized amount)  just inside the vaginal introitus with a finger-tip every night for two weeks and then Monday, Wednesday and Friday nights. Patient not taking: Reported on 11/05/2016 12/12/15  Zara Council A, PA-C  cyclobenzaprine (FLEXERIL) 5 MG tablet Take 5 mg by mouth 2 (two) times daily as needed for muscle spasms.    [provider]  estradiol (ESTRACE VAGINAL) 0.1 MG/GM vaginal cream Apply 0.5mg  (pea-sized amount)  just inside the vaginal introitus with a finger-tip every night for two weeks and then Monday, Wednesday and Friday nights. Patient not taking: Reported on 11/05/2016 12/12/15   Zara Council A, PA-C  famotidine (PEPCID) 20 MG tablet Take 1 tablet (20 mg total) by mouth 2 (two) times daily. Patient not taking: Reported on 02/01/2017  11/05/16   Earleen Newport, MD  Ferrous Sulfate 140 (45 FE) MG TBCR Take 280 mg by mouth every morning.    [provider]  fexofenadine (ALLEGRA) 180 MG tablet Take 1 tablet (180 mg total) by mouth daily. 06/21/15   Tracie Harrier, MD  fluticasone (FLONASE) 50 MCG/ACT nasal spray Place 2 sprays into both nostrils at bedtime. 05/17/15   [provider]  gabapentin (NEURONTIN) 100 MG capsule Take 100 mg by mouth at bedtime.    [provider]  glimepiride (AMARYL) 2 MG tablet Take 2.5 mg by mouth daily with breakfast.  06/03/15   [provider]  hydrALAZINE (APRESOLINE) 25 MG tablet Take 25 mg by mouth 3 (three) times daily. 06/07/15   [provider]  ipratropium (ATROVENT) 0.03 % nasal spray Place 2 sprays into both nostrils 3 (three) times daily.    [provider]  latanoprost (XALATAN) 0.005 % ophthalmic solution Place 1 drop into both eyes at bedtime.    [provider]  losartan (COZAAR) 100 MG tablet Take 100 mg by mouth daily. 06/03/15   [provider]  pantoprazole (PROTONIX) 40 MG tablet Take 40 mg by mouth daily.    [provider]  Travoprost, BAK Free, (TRAVATAN) 0.004 % SOLN ophthalmic solution 1 drop at bedtime.    [provider]  verapamil (CALAN-SR) 180 MG CR tablet Take 180 mg by mouth at bedtime. 06/03/15   [provider]  vitamin B-12 (CYANOCOBALAMIN) 1000 MCG tablet Take 1,000 mcg by mouth daily. 06/06/15   [provider]  vitamin C (ASCORBIC ACID) 250 MG tablet Take by mouth.    [provider]  Vitamin D, Ergocalciferol, (DRISDOL) 50000 UNITS CAPS capsule Take 50,000 Units by mouth once a week. 05/26/15   [provider]    Allergies Lidocaine, Novocain [procaine], Penicillins, Sulfa antibiotics, Vancomycin, and Codeine  Family History  Problem Relation Age of Onset  . Hypertension Other   . Bladder Cancer Neg Hx   . Kidney disease Neg Hx      Social History Social History   Tobacco Use  . Smoking status: Never Smoker  . Smokeless tobacco: Never Used  Substance Use Topics  . Alcohol use: No    Alcohol/week: 0.0 standard drinks  . Drug use: No    Review of Systems Unable to obtain secondary to altered mental status and critical illness.  ____________________________________________   PHYSICAL EXAM:  VITAL SIGNS: ED Triage Vitals  Enc Vitals Group     BP      Pulse      Resp      Temp      Temp src      SpO2      Weight      Height      Head Circumference      Peak Flow      Pain Score  Pain Loc      Pain Edu?      Excl. in [place]?     Constitutional: Somnolent but arousable to voice, oriented to person and place, but not time.  Very pale appearing. Eyes: Conjunctivae are normal. Head: Atraumatic. Nose: No congestion/rhinnorhea. Mouth/Throat: Mucous membranes are moist. Neck: Normal ROM Cardiovascular: Normal rate, regular rhythm. Grossly normal heart sounds. Respiratory: Normal respiratory effort.  No retractions. Lungs CTAB. Gastrointestinal: Soft and nontender. No distention.  Rectal exam with dark guaiac positive stool. Genitourinary: deferred Musculoskeletal: No lower extremity tenderness nor edema. Neurologic:  Normal speech and language.  Global weakness with no gross focal neurologic deficitsappreciated. Skin:  Skin is warm, dry and intact. No rash noted. Psychiatric: Mood and affect are normal. Speech and behavior are normal.  ____________________________________________   LABS (all labs ordered are listed, but only abnormal results are displayed)  Labs Reviewed  LACTIC ACID, PLASMA - Abnormal; Notable for the following components:      Result Value   Lactic Acid, Venous >11.0 (*)    All other components within normal limits  CBC WITH DIFFERENTIAL/PLATELET - Abnormal; Notable for the following components:   WBC 19.3 (*)    RBC 2.01 (*)    Hemoglobin 4.8 (*)    HCT 16.0 (*)     MCV 79.6 (*)    MCH 23.9 (*)    RDW 16.0 (*)    Neutro Abs 16.9 (*)    Monocytes Absolute 1.3 (*)    Abs Immature Granulocytes 0.20 (*)    All other components within normal limits  COMPREHENSIVE METABOLIC PANEL - Abnormal; Notable for the following components:   CO2 11 (*)    Glucose, Bld 155 (*)    BUN 108 (*)    Creatinine, Ser 3.40 (*)    Total Protein 5.6 (*)    Albumin 3.1 (*)    AST 499 (*)    ALT 195 (*)    GFR, Estimated 12 (*)    Anion gap 25 (*)    All other components within normal limits  TROPONIN I (HIGH SENSITIVITY) - Abnormal; Notable for the following components:   Troponin I (High Sensitivity) 24,992 (*)    All other components within normal limits  RESP PANEL BY RT-PCR (FLU A&B, COVID) ARPGX2  CULTURE, BLOOD (ROUTINE X 2)  CULTURE, BLOOD (ROUTINE X 2)  LACTIC ACID, PLASMA  URINALYSIS, COMPLETE (UACMP) WITH MICROSCOPIC  TYPE AND SCREEN  PREPARE RBC (CROSSMATCH)  ABO/RH  PREPARE PLATELET PHERESIS  TROPONIN I (HIGH SENSITIVITY)   ____________________________________________  EKG  ED ECG REPORT I, Blake Divine, the attending physician, personally viewed and interpreted this ECG.   Date: 11/27/2020  EKG Time: 12:40  Rate: 61  Rhythm: normal sinus rhythm  Axis: Normal  Intervals:none  ST&T Change: ST elevation in aVR with diffuse ST depressions    PROCEDURES  Procedure(s) performed (including Critical Care):  .Critical Care Performed by: Blake Divine, MD Authorized by: Blake Divine, MD   Critical care provider statement:    Critical care time (minutes):  45   Critical care time was exclusive of:  Separately billable procedures and treating other patients and teaching time   Critical care was necessary to treat or prevent imminent or life-threatening deterioration of the following conditions:  Circulatory failure and shock   Critical care was time spent personally by me on the following activities:  Discussions with consultants,  evaluation of patient's response to treatment, examination of patient, ordering and performing treatments and  interventions, ordering and review of laboratory studies, ordering and review of radiographic studies, pulse oximetry, re-evaluation of patient's condition, obtaining history from patient or surrogate and review of old charts   I assumed direction of critical care for this patient from another provider in my specialty: no     Care discussed with: admitting provider       ____________________________________________   INITIAL IMPRESSION / ASSESSMENT AND PLAN / ED COURSE       87 year old female with past medical history of hypertension, diabetes, and stroke who presents to the ED for worsening generalized weakness for the past 2 days, unable to get up out of bed today.  Patient appears very pale on my evaluation and daughter states that she has had dark stools recently.  Patient reportedly hemodynamically stable with EMS, but now with systolic blood pressures in the 40s or 50s.  She is somnolent but does arouse to voice and is able to answer some questions, no focal neurologic deficits noted.  Presentation concerning for GI bleed and patient was transfused with 2 units uncrossed PRBCs.  Patient's daughter is at bedside and understands that patient is at high risk for further deterioration and cardiac arrest.  I spoke with both the daughter and patient's son over the phone, who agree that we will transition her to DNR status.  Lab work confirms hemoglobin of 4.8, along with markedly elevated troponin and lactic acidosis, likely due to global hypoperfusion.  Patient started on Levophed for blood pressure support and blood pressure is now gradually improving following transfusion.  We will transfuse additional 2 units PRBCs as well as 1 unit of platelets given she is anticoagulated on Plavix.  She was given a dose of IV Protonix and case discussed with Dr. Haig Prophet of GI, who will evaluate the  patient.  Case discussed with ICU provider, who accepts patient for admission.      ____________________________________________   FINAL CLINICAL IMPRESSION(S) / ED DIAGNOSES  Final diagnoses:  Gastrointestinal hemorrhage, unspecified gastrointestinal hemorrhage type  Hemorrhagic shock [facility])     ED Discharge Orders    None       Note:  This document was prepared using Dragon voice recognition software and may include unintentional dictation errors.   Blake Divine, MD 11/27/20 [phone]

## 2020-11-27 NOTE — Plan of Care (Signed)
  Problem: Clinical Measurements: Goal: Ability to maintain clinical measurements within normal limits will improve Outcome: Progressing Goal: Diagnostic test results will improve Outcome: Progressing   Problem: Education: Goal: Knowledge of General Education information will improve Description: Including pain rating scale, medication(s)/side effects and non-pharmacologic comfort measures Outcome: Not Progressing   Problem: Health Behavior/Discharge Planning: Goal: Ability to manage health-related needs will improve Outcome: Not Progressing

## 2020-11-27 NOTE — ED Triage Notes (Signed)
X 2 days falls, weakness,

## 2020-11-27 NOTE — H&P (Signed)
CRITICAL CARE PROGRESS NOTE    Name: Katie Graves MRN: [id number] DOB: 17-Aug-1922     LOS: 0   SUBJECTIVE FINDINGS & SIGNIFICANT EVENTS    Patient description:  87 year old female with previous medical history of bilateral sensorineural hearing loss, essential hypertension, diabetes, major depressive disorder, history of CVA with basilar artery syndrome came in from home after family noted malaise fatigue with inability to walk post melanotic stools several times over the last few days starting Friday.  Patient came in profoundly hypotensive found to have severe anemia hemoglobin 4 with most recent at closer to 11 with circulatory shock.  Additional findings include AKI, transaminitis, altered mental status with confusion.  I met with family discussed goals of care CODE STATUS DNR present was his granddaughter, daughter, son who lives with patient.  Patient had GI consult placed with additional studies being planned currently.  She is receiving blood transfusion during my evaluation and is clinically improving however remains on vasopressor support with Levophed.  PCCM admission requested for hemorrhagic shock.  Lines/tubes :   Microbiology/Sepsis markers: Results for orders placed or performed in visit on 11/09/15  Microscopic Examination     Status: Abnormal   Collection Time: 11/09/15  9:38 AM   URINE  Result Value Ref Range Status   WBC, UA 0-5 0 - 5 /hpf Final   RBC, UA 0-2 0 - 2 /hpf Final   Epithelial Cells (non renal) 0-10 0 - 10 /hpf Final   Casts Present (A) None seen /lpf Final   Cast Type Hyaline casts N/A Final   Mucus, UA Present (A) Not Estab. Final   Bacteria, UA None seen None seen/Few Final    Anti-infectives:  Anti-infectives (From admission, onward)   None       Consults: GI and  PCCM    PAST MEDICAL HISTORY   Past Medical History:  Diagnosis Date  . Aortic stenosis   . Atrophic vaginitis   . Back pain   . Cancer ([place])   . CVA (cerebral infarction)   . Diabetes mellitus without complication ([place])   . Gastritis   . Hypertension   . Seasonal allergies   . Stroke [facility])    TIA x3  . Vertigo      SURGICAL HISTORY   Past Surgical History:  Procedure Laterality Date  . ABDOMINAL HYSTERECTOMY    . BACK SURGERY    . BREAST BIOPSY Left    stereotactic biopsy  . COLON SURGERY    . TONSILLECTOMY       FAMILY HISTORY   Family History  Problem Relation Age of Onset  . Hypertension Other   . Bladder Cancer Neg Hx   . Kidney disease Neg Hx      SOCIAL HISTORY   Social History   Tobacco Use  . Smoking status: Never Smoker  . Smokeless tobacco: Never Used  Substance Use Topics  . Alcohol use: No    Alcohol/week: 0.0 standard drinks  . Drug use: No     MEDICATIONS   Current Medication:  Current Facility-Administered Medications:  .  0.9 %  sodium chloride infusion, 250 mL, Intravenous, Continuous, Blake Divine, MD, Last Rate: 10 mL/hr at 11/27/20 1316, 250 mL at 11/27/20 1316 .  norepinephrine (LEVOPHED) 34m in 2554mpremix infusion, 2-10 mcg/min, Intravenous, Titrated, JeBlake DivineMD, Last Rate: 11.25 mL/hr at 11/27/20 1318, 3 mcg/min at 11/27/20 1318 .  pantoprazole (PROTONIX) injection 40 mg, 40 mg, Intravenous, Once, JeBlake DivineMD  Current Outpatient Medications:  .  albuterol (PROVENTIL HFA;VENTOLIN HFA) 108 (90 Base) MCG/ACT inhaler, Inhale 2 puffs into the lungs every 6 (six) hours as needed for wheezing or shortness of breath. (Patient not taking: Reported on 02/01/2017), Disp: 1 Inhaler, Rfl: 2 .  ALPRAZolam (XANAX) 0.25 MG tablet, Take 0.25 mg by mouth at bedtime., Disp: , Rfl:  .  aspirin EC 81 MG tablet, Take 81 mg by mouth every morning., Disp: , Rfl:  .  atorvastatin (LIPITOR) 10 MG tablet, Take 10 mg by mouth  daily at 6 PM. Take on Monday, Wednesday, and Friday., Disp: , Rfl:  .  cloNIDine (CATAPRES) 0.1 MG tablet, Take 1 tablet (0.1 mg total) by mouth every 6 (six) hours as needed (SBP>170). (Patient not taking: Reported on 02/01/2017), Disp: 60 tablet, Rfl: 11 .  clopidogrel (PLAVIX) 75 MG tablet, Take 75 mg by mouth every morning., Disp: , Rfl:  .  conjugated estrogens (PREMARIN) vaginal cream, Place 1 Applicatorful vaginally daily. Apply 0.28m (pea-sized amount)  just inside the vaginal introitus with a finger-tip every night for two weeks and then Monday, Wednesday and Friday nights. (Patient not taking: Reported on 11/05/2016), Disp: 30 g, Rfl: 12 .  cyclobenzaprine (FLEXERIL) 5 MG tablet, Take 5 mg by mouth 2 (two) times daily as needed for muscle spasms., Disp: , Rfl:  .  estradiol (ESTRACE VAGINAL) 0.1 MG/GM vaginal cream, Apply 0.539m(pea-sized amount)  just inside the vaginal introitus with a finger-tip every night for two weeks and then Monday, Wednesday and Friday nights. (Patient not taking: Reported on 11/05/2016), Disp: 30 g, Rfl: 12 .  famotidine (PEPCID) 20 MG tablet, Take 1 tablet (20 mg total) by mouth 2 (two) times daily. (Patient not taking: Reported on 02/01/2017), Disp: 60 tablet, Rfl: 1 .  Ferrous Sulfate 140 (45 FE) MG TBCR, Take 280 mg by mouth every morning., Disp: , Rfl:  .  fexofenadine (ALLEGRA) 180 MG tablet, Take 1 tablet (180 mg total) by mouth daily., Disp: 30 tablet, Rfl: 3 .  fluticasone (FLONASE) 50 MCG/ACT nasal spray, Place 2 sprays into both nostrils at bedtime., Disp: , Rfl:  .  gabapentin (NEURONTIN) 100 MG capsule, Take 100 mg by mouth at bedtime., Disp: , Rfl:  .  glimepiride (AMARYL) 2 MG tablet, Take 2.5 mg by mouth daily with breakfast. , Disp: , Rfl:  .  hydrALAZINE (APRESOLINE) 25 MG tablet, Take 25 mg by mouth 3 (three) times daily., Disp: , Rfl:  .  ipratropium (ATROVENT) 0.03 % nasal spray, Place 2 sprays into both nostrils 3 (three) times daily., Disp: , Rfl:   .  latanoprost (XALATAN) 0.005 % ophthalmic solution, Place 1 drop into both eyes at bedtime., Disp: , Rfl:  .  losartan (COZAAR) 100 MG tablet, Take 100 mg by mouth daily., Disp: , Rfl:  .  pantoprazole (PROTONIX) 40 MG tablet, Take 40 mg by mouth daily., Disp: , Rfl:  .  Travoprost, BAK Free, (TRAVATAN) 0.004 % SOLN ophthalmic solution, 1 drop at bedtime., Disp: , Rfl:  .  verapamil (CALAN-SR) 180 MG CR tablet, Take 180 mg by mouth at bedtime., Disp: , Rfl:  .  vitamin B-12 (CYANOCOBALAMIN) 1000 MCG tablet, Take 1,000 mcg by mouth daily., Disp: , Rfl:  .  vitamin C (ASCORBIC ACID) 250 MG tablet, Take by mouth., Disp: , Rfl:  .  Vitamin D, Ergocalciferol, (DRISDOL) 50000 UNITS CAPS capsule, Take 50,000 Units by mouth once a week., Disp: , Rfl:     ALLERGIES  Lidocaine, Novocain [procaine], Penicillins, Sulfa antibiotics, Vancomycin, and Codeine    REVIEW OF SYSTEMS     Unable to obtain ROS due to encephalopathy  PHYSICAL EXAMINATION   Vital Signs: Pulse Rate:  [59-92] 59 (03/20 1309) Resp:  [17-19] 19 (03/20 1309) BP: (47-133)/(28-72) 49/28 (03/20 1317) SpO2:  [93 %-100 %] 100 % (03/20 1309) Weight:  [60 kg] 60 kg (03/20 1228)  GENERAL: Mild distress age-appropriate HEAD: Normocephalic, atraumatic.  EYES: Pupils equal, round, reactive to light.  No scleral icterus.  MOUTH: Moist mucosal membrane. NECK: Supple. No thyromegaly. No nodules. No JVD.  PULMONARY: Rhonchorous breath sounds bilaterally worse on the left CARDIOVASCULAR: S1 and S2. Regular rate and rhythm. No murmurs, rubs, or gallops.  GASTROINTESTINAL: Soft, nontender, non-distended. No masses. Positive bowel sounds. No hepatosplenomegaly.  MUSCULOSKELETAL: No swelling, clubbing, or edema.  NEUROLOGIC: Mild distress due to acute illness SKIN:intact,warm,dry   PERTINENT DATA     Infusions: . sodium chloride 250 mL (11/27/20 1316)  . norepinephrine (LEVOPHED) Adult infusion 3 mcg/min (11/27/20 1318)    Scheduled Medications: . pantoprazole (PROTONIX) IV  40 mg Intravenous Once   PRN Medications:  Hemodynamic parameters:   Intake/Output: No intake/output data recorded.  Ventilator  Settings:     LAB RESULTS:  Basic Metabolic Panel: No results for input(s): NA, K, CL, CO2, GLUCOSE, BUN, CREATININE, CALCIUM, MG, PHOS in the last 168 hours. Liver Function Tests: No results for input(s): AST, ALT, ALKPHOS, BILITOT, PROT, ALBUMIN in the last 168 hours. No results for input(s): LIPASE, AMYLASE in the last 168 hours. No results for input(s): AMMONIA in the last 168 hours. CBC: Recent Labs  Lab 11/27/20 1236  WBC 19.3*  NEUTROABS 16.9*  HGB 4.8*  HCT 16.0*  MCV 79.6*  PLT 277   Cardiac Enzymes: No results for input(s): CKTOTAL, CKMB, CKMBINDEX, TROPONINI in the last 168 hours. BNP: Invalid input(s): POCBNP CBG: No results for input(s): GLUCAP in the last 168 hours.     IMAGING RESULTS:  Imaging: No results found. '@PROBHOSP' @ No results found.   ASSESSMENT AND PLAN      Hemorrhagic shock due to acute blood loss secondary to GI bleed -Patient on antiplatelets including Plavix and aspirin we will hold this for now -PRBC transfusion -monitor H&H -GI consultation-Dr. Locklear appreciate input -Monitor vitals with telemetry -GI prophylaxis IV twice daily PPI -Septic work-up wit blood cultures, UA, urine culture stool for C. difficile and GI viral profile in lieu of leukocytosis  Solid and liquid dysphagia  -Family reports is a chronic issue with recurrent regurgitation of food stuffs -GI consult-appreciated  Acute renal Failure-stage IIIb  -due to ischemia while in shock -follow chem 7 -follow UO -continue Foley Catheter-assess need daily -Volume repletion and monitor   Altered mental status with encephalopathy Likely due to severe GI bleed with electrolyte derangement and toxic metabolic encephalopathy secondary to renal failure -Treat underlying  cause, currently GI bleed  Transaminitis -Due to shock liver, IV fluid and blood resuscitation with serial monitoring   ID -continue IV abx as prescibed -follow up cultures  GI/Nutrition GI PROPHYLAXIS as indicated DIET-->TF's as tolerated Constipation protocol as indicated  ENDO - ICU hypoglycemic\Hyperglycemia protocol -check FSBS per protocol   ELECTROLYTES -follow labs as needed -replace as needed -pharmacy consultation   DVT/GI PRX ordered -SCDs  TRANSFUSIONS AS NEEDED MONITOR FSBS ASSESS the need for LABS as needed   Critical care provider statement:    Critical care time (minutes):  109   Critical care time was  exclusive of:  Separately billable procedures and treating other patients   Critical care was necessary to treat or prevent imminent or life-threatening deterioration of the following conditions:   Hemorrhagic shock, acute blood loss anemia, GI bleed, shock liver, severe AKI, sensorineural hearing loss   Critical care was time spent personally by me on the following activities:  Development of treatment plan with patient or surrogate, discussions with consultants, evaluation of patient's response to treatment, examination of patient, obtaining history from patient or surrogate, ordering and performing treatments and interventions, ordering and review of laboratory studies and re-evaluation of patient's condition.  I assumed direction of critical care for this patient from another provider in my specialty: no    This document was prepared using Dragon voice recognition software and may include unintentional dictation errors.    Ottie Glazier, M.D.  Division of [place]

## 2020-11-28 ENCOUNTER — Inpatient Hospital Stay: Payer: Medicare HMO

## 2020-11-28 DIAGNOSIS — K7200 Acute and subacute hepatic failure without coma: Secondary | ICD-10-CM | POA: Diagnosis not present

## 2020-11-28 DIAGNOSIS — D62 Acute posthemorrhagic anemia: Secondary | ICD-10-CM | POA: Diagnosis not present

## 2020-11-28 DIAGNOSIS — R410 Disorientation, unspecified: Secondary | ICD-10-CM | POA: Diagnosis not present

## 2020-11-28 DIAGNOSIS — N179 Acute kidney failure, unspecified: Secondary | ICD-10-CM | POA: Diagnosis not present

## 2020-11-28 LAB — CBC
HCT: 40.5 % (ref 36.0–46.0)
Hemoglobin: 13.4 g/dL (ref 12.0–15.0)
MCH: 27.8 pg (ref 26.0–34.0)
MCHC: 33.1 g/dL (ref 30.0–36.0)
MCV: 84 fL (ref 80.0–100.0)
Platelets: 209 10*3/uL (ref 150–400)
RBC: 4.82 MIL/uL (ref 3.87–5.11)
RDW: 16.5 % — ABNORMAL HIGH (ref 11.5–15.5)
WBC: 17.8 10*3/uL — ABNORMAL HIGH (ref 4.0–10.5)
nRBC: 0.3 % — ABNORMAL HIGH (ref 0.0–0.2)

## 2020-11-28 LAB — TYPE AND SCREEN
ABO/RH(D): O POS
Antibody Screen: NEGATIVE
Unit division: 0
Unit division: 0
Unit division: 0
Unit division: 0

## 2020-11-28 LAB — COMPREHENSIVE METABOLIC PANEL
ALT: 555 U/L — ABNORMAL HIGH (ref 0–44)
AST: 909 U/L — ABNORMAL HIGH (ref 15–41)
Albumin: 3.3 g/dL — ABNORMAL LOW (ref 3.5–5.0)
Alkaline Phosphatase: 85 U/L (ref 38–126)
Anion gap: 16 — ABNORMAL HIGH (ref 5–15)
BUN: 107 mg/dL — ABNORMAL HIGH (ref 8–23)
CO2: 17 mmol/L — ABNORMAL LOW (ref 22–32)
Calcium: 8.3 mg/dL — ABNORMAL LOW (ref 8.9–10.3)
Chloride: 105 mmol/L (ref 98–111)
Creatinine, Ser: 3.48 mg/dL — ABNORMAL HIGH (ref 0.44–1.00)
GFR, Estimated: 11 mL/min — ABNORMAL LOW (ref >60)
Glucose, Bld: 272 mg/dL — ABNORMAL HIGH (ref 70–99)
Potassium: 4 mmol/L (ref 3.5–5.1)
Sodium: 138 mmol/L (ref 135–145)
Total Bilirubin: 0.9 mg/dL (ref 0.3–1.2)
Total Protein: 6 g/dL — ABNORMAL LOW (ref 6.5–8.1)

## 2020-11-28 LAB — BPAM RBC
Blood Product Expiration Date: 202204232359
Blood Product Expiration Date: 202204232359
Blood Product Expiration Date: 202204272359
Blood Product Expiration Date: 202204272359
ISSUE DATE / TIME: 202203201300
ISSUE DATE / TIME: 202203201300
ISSUE DATE / TIME: 202203201424
ISSUE DATE / TIME: 202203201424
Unit Type and Rh: 5100
Unit Type and Rh: 5100
Unit Type and Rh: 5100
Unit Type and Rh: 5100

## 2020-11-28 LAB — PREPARE PLATELET PHERESIS: Unit division: 0

## 2020-11-28 LAB — HEMOGLOBIN AND HEMATOCRIT, BLOOD
HCT: 37.4 % (ref 36.0–46.0)
Hemoglobin: 12.5 g/dL (ref 12.0–15.0)

## 2020-11-28 LAB — GLUCOSE, CAPILLARY
Glucose-Capillary: 122 mg/dL — ABNORMAL HIGH (ref 70–99)
Glucose-Capillary: 134 mg/dL — ABNORMAL HIGH (ref 70–99)
Glucose-Capillary: 205 mg/dL — ABNORMAL HIGH (ref 70–99)
Glucose-Capillary: 223 mg/dL — ABNORMAL HIGH (ref 70–99)

## 2020-11-28 LAB — URINALYSIS, COMPLETE (UACMP) WITH MICROSCOPIC
Bacteria, UA: NONE SEEN
Bilirubin Urine: NEGATIVE
Glucose, UA: 50 mg/dL — AB
Ketones, ur: 5 mg/dL — AB
Leukocytes,Ua: NEGATIVE
Nitrite: NEGATIVE
Protein, ur: NEGATIVE mg/dL
Specific Gravity, Urine: 1.013 (ref 1.005–1.030)
Squamous Epithelial / HPF: NONE SEEN (ref 0–5)
pH: 5 (ref 5.0–8.0)

## 2020-11-28 LAB — MAGNESIUM: Magnesium: 2.2 mg/dL (ref 1.7–2.4)

## 2020-11-28 LAB — BPAM PLATELET PHERESIS
Blood Product Expiration Date: 202203222359
ISSUE DATE / TIME: 202203201509
Unit Type and Rh: 7300

## 2020-11-28 LAB — CK: Total CK: 1136 U/L — ABNORMAL HIGH (ref 38–234)

## 2020-11-28 LAB — PHOSPHORUS: Phosphorus: 7.2 mg/dL — ABNORMAL HIGH (ref 2.5–4.6)

## 2020-11-28 LAB — LACTIC ACID, PLASMA: Lactic Acid, Venous: 2.4 mmol/L (ref 0.5–1.9)

## 2020-11-28 MED ORDER — SODIUM CHLORIDE 0.9 % IV SOLN: INTRAVENOUS | Status: AC

## 2020-11-28 MED ORDER — CHLORHEXIDINE GLUCONATE CLOTH 2 % EX PADS
6.0000 | MEDICATED_PAD | Freq: Every day | CUTANEOUS | Status: DC
  Administered 2020-11-28 – 2020-11-30 (×3): 6 via TOPICAL

## 2020-11-28 MED ORDER — FENTANYL CITRATE (PF) 100 MCG/2ML IJ SOLN
12.5000 ug | Freq: Once | INTRAMUSCULAR | Status: AC
  Administered 2020-11-28: 12.5 ug via INTRAVENOUS
  Filled 2020-11-28: qty 2

## 2020-11-28 MED ORDER — INSULIN ASPART 100 UNIT/ML ~~LOC~~ SOLN
0.0000 [IU] | SUBCUTANEOUS | Status: DC
  Administered 2020-11-28 (×2): 3 [IU] via SUBCUTANEOUS
  Administered 2020-11-29: 1 [IU] via SUBCUTANEOUS
  Administered 2020-11-29: 3 [IU] via SUBCUTANEOUS
  Administered 2020-11-29: 2 [IU] via SUBCUTANEOUS
  Administered 2020-11-29 (×3): 1 [IU] via SUBCUTANEOUS
  Filled 2020-11-28 (×7): qty 1

## 2020-11-28 MED ORDER — PANTOPRAZOLE SODIUM 40 MG IV SOLR
40.0000 mg | Freq: Two times a day (BID) | INTRAVENOUS | Status: DC
  Administered 2020-11-28 – 2020-12-04 (×12): 40 mg via INTRAVENOUS
  Filled 2020-11-28 (×13): qty 40

## 2020-11-28 MED ORDER — LIDOCAINE 5 % EX PTCH
1.0000 | MEDICATED_PATCH | CUTANEOUS | Status: DC
  Administered 2020-11-28 – 2020-12-04 (×6): 1 via TRANSDERMAL
  Filled 2020-11-28 (×8): qty 1

## 2020-11-28 NOTE — Progress Notes (Signed)
Initial call to provide an Advanced Directive for patient per family request. Family has already submitted an attorney provided and notarized document to registration, a hard copy is present in patient's chart located at desk. After consulting with charge nurseJudson Roch) and CSW Maurice Small), the family has all rights to provide medical decisions without document. I relayed information to family members, (son and daughter) bedside. They were able to share raw emotions about their mother's current condition. They shared stories and the desire for her to be at peace. This patient has a history with this [facility]. 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

## 2020-11-28 NOTE — Progress Notes (Signed)
CRITICAL CARE PROGRESS NOTE    Name: Katie Graves MRN: [id number] DOB: 10-28-1921     LOS: 1   SUBJECTIVE FINDINGS & SIGNIFICANT EVENTS    Patient description:  87 year old female with previous medical history of bilateral sensorineural hearing loss, essential hypertension, diabetes, major depressive disorder, history of CVA with basilar artery syndrome came in from home after family noted malaise fatigue with inability to walk post melanotic stools several times over the last few days starting Friday.  Patient came in profoundly hypotensive found to have severe anemia hemoglobin 4 with most recent at closer to 11 with circulatory shock.  Additional findings include AKI, transaminitis, altered mental status with confusion.  I met with family discussed goals of care CODE STATUS DNR present was his granddaughter, daughter, son who lives with patient.  Patient had GI consult placed with additional studies being planned currently.  She is receiving blood transfusion during my evaluation and is clinically improving however remains on vasopressor support with Levophed.  PCCM admission requested for hemorrhagic shock.  Lines/tubes :   Microbiology/Sepsis markers: Results for orders placed or performed during the hospital encounter of 11/27/20  Culture, blood (routine x 2)     Status: None (Preliminary result)   Collection Time: 11/27/20 12:36 PM   Specimen: BLOOD  Result Value Ref Range Status   Specimen Description BLOOD RIGHT ANTECUBITAL  Final   Special Requests   Final    BOTTLES DRAWN AEROBIC AND ANAEROBIC Blood Culture results may not be optimal due to an inadequate volume of blood received in culture bottles   Culture   Final    NO GROWTH < 24 HOURS Performed at [facility], [address],  [place], [place] [address]    Report Status PENDING  Incomplete  Culture, blood (routine x 2)     Status: None (Preliminary result)   Collection Time: 11/27/20 12:39 PM   Specimen: BLOOD  Result Value Ref Range Status   Specimen Description BLOOD LEFT ANTECUBITAL  Final   Special Requests   Final    BOTTLES DRAWN AEROBIC AND ANAEROBIC Blood Culture adequate volume   Culture   Final    NO GROWTH < 24 HOURS Performed at [facility], [address] [place],  [address]    Report Status PENDING  Incomplete  Resp Panel by RT-PCR (Flu A&B, Covid) Nasopharyngeal Swab     Status: None   Collection Time: 11/27/20 12:45 PM   Specimen: Nasopharyngeal Swab; Nasopharyngeal(NP) swabs in vial transport medium  Result Value Ref Range Status   SARS Coronavirus 2 by RT PCR NEGATIVE NEGATIVE Final    Comment: (NOTE) SARS-CoV-2 target nucleic acids are NOT DETECTED.  The SARS-CoV-2 RNA is generally detectable in upper respiratory specimens during the acute phase of infection. The lowest concentration of SARS-CoV-2 viral copies this assay can detect is 138 copies/mL. A negative result does not preclude SARS-Cov-2 infection and should not be used as the sole basis for treatment or other patient management decisions. A negative result may occur with  improper specimen collection/handling, submission of specimen other than nasopharyngeal swab, presence of viral mutation(s) within the areas targeted by this assay, and inadequate number of viral copies(<138 copies/mL). A negative result must be combined with clinical observations, patient history, and epidemiological information. The expected result is Negative.  Fact Sheet for Patients:  EntrepreneurPulse.com.au  Fact Sheet for Healthcare Providers:  IncredibleEmployment.be  This test is no t yet approved or cleared by the [place]  FDA and  has been authorized for detection and/or diagnosis of  SARS-CoV-2 by FDA under an Emergency Use Authorization (EUA). This EUA will remain  in effect (meaning this test can be used) for the duration of the COVID-19 declaration under Section 564(b)(1) of the Act, 21 U.S.C.section 360bbb-3(b)(1), unless the authorization is terminated  or revoked sooner.       Influenza A by PCR NEGATIVE NEGATIVE Final   Influenza B by PCR NEGATIVE NEGATIVE Final    Comment: (NOTE) The Xpert Xpress SARS-CoV-2/FLU/RSV plus assay is intended as an aid in the diagnosis of influenza from Nasopharyngeal swab specimens and should not be used as a sole basis for treatment. Nasal washings and aspirates are unacceptable for Xpert Xpress SARS-CoV-2/FLU/RSV testing.  Fact Sheet for Patients: EntrepreneurPulse.com.au  Fact Sheet for Healthcare Providers: IncredibleEmployment.be  This test is not yet approved or cleared by the [place] FDA and has been authorized for detection and/or diagnosis of SARS-CoV-2 by FDA under an Emergency Use Authorization (EUA). This EUA will remain in effect (meaning this test can be used) for the duration of the COVID-19 declaration under Section 564(b)(1) of the Act, 21 U.S.C. section 360bbb-3(b)(1), unless the authorization is terminated or revoked.  Performed at [facility], [address] [place], [place] [address]   MRSA PCR Screening     Status: None   Collection Time: 11/27/20  5:48 PM   Specimen: Nasopharyngeal  Result Value Ref Range Status   MRSA by PCR NEGATIVE NEGATIVE Final    Comment:        The GeneXpert MRSA Assay (FDA approved for NASAL specimens only), is one component of a comprehensive MRSA colonization surveillance program. It is not intended to diagnose MRSA infection nor to guide or monitor treatment for MRSA infections. Performed at [facility], [address], [place], [place] [address]     Anti-infectives:  Anti-infectives (From  admission, onward)   None       Consults: GI and PCCM    PAST MEDICAL HISTORY   Past Medical History:  Diagnosis Date  . Aortic stenosis   . Atrophic vaginitis   . Back pain   . Cancer ([place])   . CVA (cerebral infarction)   . Diabetes mellitus without complication ([place])   . Gastritis   . Hypertension   . Seasonal allergies   . Stroke [facility])    TIA x3  . Vertigo      SURGICAL HISTORY   Past Surgical History:  Procedure Laterality Date  . ABDOMINAL HYSTERECTOMY    . BACK SURGERY    . BREAST BIOPSY Left    stereotactic biopsy  . COLON SURGERY    . TONSILLECTOMY       FAMILY HISTORY   Family History  Problem Relation Age of Onset  . Hypertension Other   . Bladder Cancer Neg Hx   . Kidney disease Neg Hx      SOCIAL HISTORY   Social History   Tobacco Use  . Smoking status: Never Smoker  . Smokeless tobacco: Never Used  Substance Use Topics  . Alcohol use: No    Alcohol/week: 0.0 standard drinks  . Drug use: No     MEDICATIONS   Current Medication:  Current Facility-Administered Medications:  .  0.9 %  sodium chloride infusion, 250 mL, Intravenous, Continuous, Blake Divine, MD, Stopped at 11/27/20 1534 .  0.9 %  sodium chloride infusion, , Intravenous, Continuous, Rust-Chester, Britton L, NP, Last Rate: 50 mL/hr at  11/28/20 1200, Infusion Verify at 11/28/20 1200 .  Chlorhexidine Gluconate Cloth 2 % PADS 6 each, 6 each, Topical, Daily, Rust-Chester, Huel Cote, NP, 6 each at 11/28/20 0944 .  insulin aspart (novoLOG) injection 0-9 Units, 0-9 Units, Subcutaneous, Q4H, Nelle Don, MD, 3 Units at 11/28/20 1147 .  lidocaine (LIDODERM) 5 % 1 patch, 1 patch, Transdermal, Q24H, Rust-Chester, Britton L, NP, 1 patch at 11/28/20 0245 .  MEDLINE mouth rinse, 15 mL, Mouth Rinse, BID, Aleskerov, Fuad, MD, 15 mL at 11/28/20 0944 .  pantoprazole (PROTONIX) injection 40 mg, 40 mg, Intravenous, Q12H, Locklear, Hilton Cork, MD    ALLERGIES   Lidocaine,  Novocain [procaine], Penicillins, Sulfa antibiotics, Vancomycin, and Codeine    REVIEW OF SYSTEMS     Unable to obtain ROS due to encephalopathy  PHYSICAL EXAMINATION   Vital Signs: Temp:  [95.4 F (35.2 C)-97.7 F (36.5 C)] 96.2 F (35.7 C) (03/21 1150) Pulse Rate:  [54-76] 73 (03/21 1200) Resp:  [13-23] 20 (03/21 1200) BP: (45-176)/(34-87) 139/70 (03/21 1200) SpO2:  [95 %-100 %] 97 % (03/21 1200) Weight:  [56.7 kg] 56.7 kg (03/21 0500)  GENERAL:no distress age-appropriate HEAD: Normocephalic, atraumatic.  EYES: Pupils equal, round, reactive to light.  No scleral icterus.  MOUTH: Moist mucosal membrane. NECK: Supple. No thyromegaly. No nodules. No JVD.  PULMONARY: Rhonchorous breath sounds bilaterally worse on the left CARDIOVASCULAR: S1 and S2. Regular rate and rhythm. No murmurs, rubs, or gallops.  GASTROINTESTINAL: Soft, nontender, non-distended. No masses. Positive bowel sounds. No hepatosplenomegaly.  MUSCULOSKELETAL: No swelling, clubbing, or edema.  NEUROLOGIC: no distress due to acute illness SKIN:intact,warm,dry   PERTINENT DATA     Infusions: . sodium chloride Stopped (11/27/20 1534)  . sodium chloride 50 mL/hr at 11/28/20 1200   Scheduled Medications: . Chlorhexidine Gluconate Cloth  6 each Topical Daily  . insulin aspart  0-9 Units Subcutaneous Q4H  . lidocaine  1 patch Transdermal Q24H  . mouth rinse  15 mL Mouth Rinse BID  . pantoprazole (PROTONIX) IV  40 mg Intravenous Q12H   PRN Medications:  Hemodynamic parameters:   Intake/Output: 03/20 0701 - 03/21 0700 In: 4326.3 [I.V.:776.3; Blood:2500; IV Piggyback:1050] Out: -   Ventilator  Settings:     LAB RESULTS:  Basic Metabolic Panel: Recent Labs  Lab 11/27/20 1236 11/28/20 0436  NA 139 138  K 4.3 4.0  CL 103 105  CO2 11* 17*  GLUCOSE 155* 272*  BUN 108* 107*  CREATININE 3.40* 3.48*  CALCIUM 9.1 8.3*  MG  --  2.2  PHOS  --  7.2*   Liver Function Tests: Recent Labs  Lab  11/27/20 1236 11/28/20 0436  AST 499* 909*  ALT 195* 555*  ALKPHOS 70 85  BILITOT 0.7 0.9  PROT 5.6* 6.0*  ALBUMIN 3.1* 3.3*   No results for input(s): LIPASE, AMYLASE in the last 168 hours. No results for input(s): AMMONIA in the last 168 hours. CBC: Recent Labs  Lab 11/27/20 1236 11/27/20 1907 11/27/20 2356 11/28/20 0436  WBC 19.3*  --   --  17.8*  NEUTROABS 16.9*  --   --   --   HGB 4.8* 13.1 12.5 13.4  HCT 16.0* 38.3 37.4 40.5  MCV 79.6*  --   --  84.0  PLT 277  --   --  209   Cardiac Enzymes: No results for input(s): CKTOTAL, CKMB, CKMBINDEX, TROPONINI in the last 168 hours. BNP: Invalid input(s): POCBNP CBG: Recent Labs  Lab 11/27/20 1716 11/28/20 1128  GLUCAP 235* 223*       IMAGING RESULTS:  Imaging: DG Chest Portable 1 View  Result Date: 11/27/2020 CLINICAL DATA:  Multiple falls and weakness for 2 days. EXAM: PORTABLE CHEST 1 VIEW COMPARISON:  Chest radiograph dated 11/05/2016. FINDINGS: The heart size is normal. Vascular calcifications are seen in the aortic arch. Both lungs are clear. Degenerative changes are seen in the spine. IMPRESSION: No active disease. Electronically Signed   By: Zerita Boers M.D.   On: 11/27/2020 13:41   CT CHEST ABDOMEN PELVIS WO CONTRAST  Result Date: 11/27/2020 CLINICAL DATA:  Mid-back pain fall history with ongoing back pain EXAM: CT CHEST, ABDOMEN AND PELVIS WITHOUT CONTRAST TECHNIQUE: Multidetector CT imaging of the chest, abdomen and pelvis was performed following the standard protocol without IV contrast. COMPARISON:  CT abdomen pelvis 01/28/2017 FINDINGS: CT CHEST FINDINGS Cardiovascular: Enlarged left atrium. Likely enlarged left ventricle. Severe aortic valve leaflet calcifications. Severe mitral annular calcifications. No significant pericardial effusion. The thoracic aorta is normal in caliber. Severe atherosclerotic plaque of the thoracic aorta. Mild to moderate three-vessel coronary artery calcifications.  Mediastinum/Nodes: No enlarged mediastinal or axillary lymph nodes. No gross hilar adenopathy, noting limited sensitivity for the detection of hilar adenopathy on this noncontrast study. There is a 1.1 cm hypodense lesion within the right thyroid gland. Trachea demonstrate no significant findings. Question esophageal wall thickening with limited evaluation on this noncontrast study. Air-fluid level within the esophageal lumen. Lungs/Pleura: No focal consolidation. No pulmonary nodule. No pulmonary mass. Subsegmental atelectasis appear bilateral trace pleural effusions. No pneumothorax. Musculoskeletal: No chest wall abnormality. No suspicious lytic or blastic osseous lesions. No acute displaced rib or sternal fracture. See below regarding spinal fractures. CT ABDOMEN PELVIS FINDINGS Hepatobiliary: No focal liver abnormality. No gallstones, gallbladder wall thickening, or pericholecystic fluid. No biliary dilatation. Pancreas: Diffusely atrophic. No focal lesion. Otherwise normal pancreatic contour. No surrounding inflammatory changes. No main pancreatic ductal dilatation. Spleen: Normal in size without focal abnormality. Adrenals/Urinary Tract: Hyperplasia of bilateral adrenal glands with question adenoma on within the left right. Bilateral renal cortical scarring. Subcentimeter hyperdense right renal lesion (2:60). No nephrolithiasis, no hydronephrosis, and no contour-deforming renal mass. No ureterolithiasis or hydroureter. The urinary bladder is unremarkable. Stomach/Bowel: Ascending colon surgical changes. Stomach is within normal limits. No evidence of bowel wall thickening or dilatation. Diffuse descending colon and sigmoid diverticulosis. Appendix likely surgically removed. Vascular/Lymphatic: No abdominal aorta or iliac aneurysm. Severe atherosclerotic plaque of the aorta and its branches. Prominent but nonenlarged retroperitoneal lymph nodes. No abdominal, pelvic, or inguinal lymphadenopathy. Reproductive:  Status post hysterectomy. No adnexal masses. Other: No intraperitoneal free fluid. No intraperitoneal free gas. No organized fluid collection. Musculoskeletal: Diffuse mild subcutaneus soft tissue edema no abdominal wall hernia or abnormality. No suspicious lytic or blastic osseous lesions. No acute displaced fracture. Multilevel degenerative changes of the spine. Grade 1 anterolisthesis of L4 on L5. Pubic symphysis degenerative changes. Age-indeterminate, possibly chronic, T9 compression fracture with greater than 35% height loss. Interval development of a a T12 and L1 compression fracture. Greater than a year 60% height loss at the T12 level and greater than 5% height loss at the L1 level. No retropulsion into the central canal. IMPRESSION: 1. Likely acute T12 and L1 compression fractures. Age-indeterminate T9 compression fracture. Correlate with midline point tenderness to evaluate for acuity. 2. Otherwise no acute intrathoracic, intra-abdominal, intrapelvic abnormality with limited evaluation on this noncontrast study. 3. Question esophageal wall thickening with limited evaluation on this noncontrast study. 4. Other imaging findings  of potential clinical significance: Trace bilateral pleural effusions. Colonic diverticulosis with no acute diverticulitis. Indetemrinate subcentimeter hyperdense right renal lesion. Severe aortic valve leaflet and mitral annular calcifications. Aortic Atherosclerosis (ICD10-I70.0) including coronary artery calcifications. Electronically Signed   By: Iven Finn M.D.   On: 11/27/2020 23:21   '@[email or web address]' @ CT CHEST ABDOMEN PELVIS WO CONTRAST  Result Date: 11/27/2020 CLINICAL DATA:  Mid-back pain fall history with ongoing back pain EXAM: CT CHEST, ABDOMEN AND PELVIS WITHOUT CONTRAST TECHNIQUE: Multidetector CT imaging of the chest, abdomen and pelvis was performed following the standard protocol without IV contrast. COMPARISON:  CT abdomen pelvis 01/28/2017 FINDINGS: CT CHEST  FINDINGS Cardiovascular: Enlarged left atrium. Likely enlarged left ventricle. Severe aortic valve leaflet calcifications. Severe mitral annular calcifications. No significant pericardial effusion. The thoracic aorta is normal in caliber. Severe atherosclerotic plaque of the thoracic aorta. Mild to moderate three-vessel coronary artery calcifications. Mediastinum/Nodes: No enlarged mediastinal or axillary lymph nodes. No gross hilar adenopathy, noting limited sensitivity for the detection of hilar adenopathy on this noncontrast study. There is a 1.1 cm hypodense lesion within the right thyroid gland. Trachea demonstrate no significant findings. Question esophageal wall thickening with limited evaluation on this noncontrast study. Air-fluid level within the esophageal lumen. Lungs/Pleura: No focal consolidation. No pulmonary nodule. No pulmonary mass. Subsegmental atelectasis appear bilateral trace pleural effusions. No pneumothorax. Musculoskeletal: No chest wall abnormality. No suspicious lytic or blastic osseous lesions. No acute displaced rib or sternal fracture. See below regarding spinal fractures. CT ABDOMEN PELVIS FINDINGS Hepatobiliary: No focal liver abnormality. No gallstones, gallbladder wall thickening, or pericholecystic fluid. No biliary dilatation. Pancreas: Diffusely atrophic. No focal lesion. Otherwise normal pancreatic contour. No surrounding inflammatory changes. No main pancreatic ductal dilatation. Spleen: Normal in size without focal abnormality. Adrenals/Urinary Tract: Hyperplasia of bilateral adrenal glands with question adenoma on within the left right. Bilateral renal cortical scarring. Subcentimeter hyperdense right renal lesion (2:60). No nephrolithiasis, no hydronephrosis, and no contour-deforming renal mass. No ureterolithiasis or hydroureter. The urinary bladder is unremarkable. Stomach/Bowel: Ascending colon surgical changes. Stomach is within normal limits. No evidence of bowel wall  thickening or dilatation. Diffuse descending colon and sigmoid diverticulosis. Appendix likely surgically removed. Vascular/Lymphatic: No abdominal aorta or iliac aneurysm. Severe atherosclerotic plaque of the aorta and its branches. Prominent but nonenlarged retroperitoneal lymph nodes. No abdominal, pelvic, or inguinal lymphadenopathy. Reproductive: Status post hysterectomy. No adnexal masses. Other: No intraperitoneal free fluid. No intraperitoneal free gas. No organized fluid collection. Musculoskeletal: Diffuse mild subcutaneus soft tissue edema no abdominal wall hernia or abnormality. No suspicious lytic or blastic osseous lesions. No acute displaced fracture. Multilevel degenerative changes of the spine. Grade 1 anterolisthesis of L4 on L5. Pubic symphysis degenerative changes. Age-indeterminate, possibly chronic, T9 compression fracture with greater than 35% height loss. Interval development of a a T12 and L1 compression fracture. Greater than a year 60% height loss at the T12 level and greater than 5% height loss at the L1 level. No retropulsion into the central canal. IMPRESSION: 1. Likely acute T12 and L1 compression fractures. Age-indeterminate T9 compression fracture. Correlate with midline point tenderness to evaluate for acuity. 2. Otherwise no acute intrathoracic, intra-abdominal, intrapelvic abnormality with limited evaluation on this noncontrast study. 3. Question esophageal wall thickening with limited evaluation on this noncontrast study. 4. Other imaging findings of potential clinical significance: Trace bilateral pleural effusions. Colonic diverticulosis with no acute diverticulitis. Indetemrinate subcentimeter hyperdense right renal lesion. Severe aortic valve leaflet and mitral annular calcifications. Aortic Atherosclerosis (ICD10-I70.0) including coronary artery calcifications. Electronically  Signed   By: Iven Finn M.D.   On: 11/27/2020 23:21     ASSESSMENT AND PLAN       Hemorrhagic shock due to acute blood loss secondary to GI bleed Lab Results  Component Value Date   HCT 40.5 11/28/2020   -Patient on antiplatelets including Plavix and aspirin these were held -PRBC 4u transfusion with over 8g rise from presentation -Trend -GI consultation-Dr. Locklear appreciate input, spoke with Tomasita Crumble today, will follow -Monitor vitals with telemetry -GI prophylaxis IV twice daily PPI -Septic work-up wit blood cultures, UA, urine culture stool for C. difficile and GI viral profile in lieu of leukocytosis  Solid and liquid dysphagia  -Family reports is a chronic issue with recurrent regurgitation of food stuffs -GI consult-appreciated  Acute renal Failure-stage IIIb  -due to ischemia while in shock -follow renal indices, creatinine relatively flat circa 3.45 -follow UO -continue Foley Catheter-assess need daily -Volume repletion and monitor   Altered mental status with encephalopathy Likely due to severe GI bleed with electrolyte derangement and toxic metabolic encephalopathy secondary to renal failure -Treat underlying cause, currently GI bleed  Transaminitis -Due to shock liver, IV fluid and blood resuscitation with serial monitoring -continues to climb, follow for peak  ID -continue IV abx as prescibed -follow up cultures, thus far NG  GI/Nutrition GI PROPHYLAXIS as indicated DIET-->TF's as tolerated Constipation protocol as indicated  ENDO - ICU hypoglycemic\Hyperglycemia protocol -check FSBS per protocol -SSI initiated   ELECTROLYTES -follow labs as needed -replace as needed -pharmacy consultation   DVT/GI PRX ordered -SCDs  TRANSFUSIONS AS NEEDED MONITOR FSBS ASSESS the need for LABS as needed

## 2020-11-28 NOTE — Consult Note (Signed)
[place] for Electrolyte Monitoring and Replacement   Recent Labs: Potassium (mmol/L)  Date Value  11/28/2020 4.0  04/21/2013 4.6   Magnesium (mg/dL)  Date Value  11/28/2020 2.2   Calcium (mg/dL)  Date Value  11/28/2020 8.3 (L)   Calcium, Total (mg/dL)  Date Value  04/21/2013 9.4   Albumin (g/dL)  Date Value  11/28/2020 3.3 (L)  03/30/2013 3.8   Phosphorus (mg/dL)  Date Value  11/28/2020 7.2 (H)   Sodium (mmol/L)  Date Value  11/28/2020 138  04/21/2013 133 (L)   Corrected Ca: 8.86  Assessment: Patient is a 87 y/o F with medical history including hearing loss, HTN, diabetes, MDD, history of CVA who presented to the ED 3/20 with falls / weakness and history of melenotic stools. Patient admitted with hemorrhagic shock secondary to suspected GIB and acute renal failure, Scr 3.40>3.48. Pharmacy has been consulted to assist with electrolyte monitoring and replacement as indicated.  MIVF: 0.9% NaCl @ 50 mL/hr  Goal of Therapy:  Electrolytes within normal limits  Plan:  --No electrolyte replacement warranted at this time --Will follow-up electrolytes with AM labs   Benn Moulder, PharmD Pharmacy Resident  11/28/2020 8:31 AM

## 2020-11-28 NOTE — Progress Notes (Signed)
GI Inpatient Follow-up Note  Subjective:  Patient with altered mental status today. Off pressors and hemoglobin stable.  Scheduled Inpatient Medications:  . Chlorhexidine Gluconate Cloth  6 each Topical Daily  . insulin aspart  0-9 Units Subcutaneous Q4H  . lidocaine  1 patch Transdermal Q24H  . mouth rinse  15 mL Mouth Rinse BID  . pantoprazole (PROTONIX) IV  40 mg Intravenous Q12H    Continuous Inpatient Infusions:   . sodium chloride Stopped (11/27/20 1534)  . sodium chloride 50 mL/hr at 11/28/20 1200    PRN Inpatient Medications:    Review of Systems:  Unable to assess due to mental status   Physical Examination: BP 139/70 (BP Location: Right Arm)   Pulse 73   Temp (!) 96.2 F (35.7 C) (Axillary)   Resp 20   Ht 5\' 7"  (1.702 m)   Wt 56.7 kg   SpO2 97%   BMI 19.58 kg/m  Gen: NAD HEENT: PEERLA Neck: supple Chest: No respiratory distress CV: RRR Abd: soft, non-tender, non-distended Ext: no edema, well perfused with 2+ pulses, Skin: no rash or lesions noted Lymph: no LAD  Data: Lab Results  Component Value Date   WBC 17.8 (H) 11/28/2020   HGB 13.4 11/28/2020   HCT 40.5 11/28/2020   MCV 84.0 11/28/2020   PLT 209 11/28/2020   Recent Labs  Lab 11/27/20 1907 11/27/20 2356 11/28/20 0436  HGB 13.1 12.5 13.4   Lab Results  Component Value Date   NA 138 11/28/2020   K 4.0 11/28/2020   CL 105 11/28/2020   CO2 17 (L) 11/28/2020   BUN 107 (H) 11/28/2020   CREATININE 3.48 (H) 11/28/2020   Lab Results  Component Value Date   ALT 555 (H) 11/28/2020   AST 909 (H) 11/28/2020   ALKPHOS 85 11/28/2020   BILITOT 0.9 11/28/2020   No results for input(s): APTT, INR, PTT in the last 168 hours. Assessment/Plan: Katie Graves is a 87 y.o. female lady with undifferentiated shock that has resolved. Hemoglobin went from 4 --> 13 with 4 units with no overt GI bleeding. I do not believe this was hemodynamically significant bleeding. Regardless given stable hemoglobin, no  overt GI bleeding, and risk/benefit of a procedure being more risky at this point, will not plan on any EGD right now  Recommendations:  - continue IV PPI BID - would strongly consider goals of care conversation with family with emphasis on comfort care given acute spinal fractures - daily cbc's - if mentation improves can consider barium swallow to evaluate long history of dysphagia  Will continue to follow. Please call with any questions or concerns.  Raylene Miyamoto MD, MPH [place]

## 2020-11-29 ENCOUNTER — Encounter: Payer: Self-pay | Admitting: Pulmonary Disease

## 2020-11-29 DIAGNOSIS — Z515 Encounter for palliative care: Secondary | ICD-10-CM | POA: Diagnosis not present

## 2020-11-29 DIAGNOSIS — D62 Acute posthemorrhagic anemia: Secondary | ICD-10-CM | POA: Diagnosis not present

## 2020-11-29 DIAGNOSIS — K922 Gastrointestinal hemorrhage, unspecified: Secondary | ICD-10-CM | POA: Diagnosis not present

## 2020-11-29 DIAGNOSIS — R578 Other shock: Secondary | ICD-10-CM | POA: Diagnosis not present

## 2020-11-29 DIAGNOSIS — Z7189 Other specified counseling: Secondary | ICD-10-CM | POA: Diagnosis not present

## 2020-11-29 LAB — RENAL FUNCTION PANEL
Albumin: 2.7 g/dL — ABNORMAL LOW (ref 3.5–5.0)
Anion gap: 13 (ref 5–15)
BUN: 135 mg/dL — ABNORMAL HIGH (ref 8–23)
CO2: 18 mmol/L — ABNORMAL LOW (ref 22–32)
Calcium: 8.1 mg/dL — ABNORMAL LOW (ref 8.9–10.3)
Chloride: 113 mmol/L — ABNORMAL HIGH (ref 98–111)
Creatinine, Ser: 3.94 mg/dL — ABNORMAL HIGH (ref 0.44–1.00)
GFR, Estimated: 10 mL/min — ABNORMAL LOW (ref >60)
Glucose, Bld: 147 mg/dL — ABNORMAL HIGH (ref 70–99)
Phosphorus: 6.4 mg/dL — ABNORMAL HIGH (ref 2.5–4.6)
Potassium: 3.8 mmol/L (ref 3.5–5.1)
Sodium: 144 mmol/L (ref 135–145)

## 2020-11-29 LAB — GLUCOSE, CAPILLARY
Glucose-Capillary: 140 mg/dL — ABNORMAL HIGH (ref 70–99)
Glucose-Capillary: 122 mg/dL — ABNORMAL HIGH (ref 70–99)
Glucose-Capillary: 110 mg/dL — ABNORMAL HIGH (ref 70–99)
Glucose-Capillary: 208 mg/dL — ABNORMAL HIGH (ref 70–99)
Glucose-Capillary: 183 mg/dL — ABNORMAL HIGH (ref 70–99)
Glucose-Capillary: 145 mg/dL — ABNORMAL HIGH (ref 70–99)

## 2020-11-29 LAB — CBC
HCT: 35.2 % — ABNORMAL LOW (ref 36.0–46.0)
Hemoglobin: 11.9 g/dL — ABNORMAL LOW (ref 12.0–15.0)
MCH: 28.2 pg (ref 26.0–34.0)
MCHC: 33.8 g/dL (ref 30.0–36.0)
MCV: 83.4 fL (ref 80.0–100.0)
Platelets: 141 10*3/uL — ABNORMAL LOW (ref 150–400)
RBC: 4.22 MIL/uL (ref 3.87–5.11)
RDW: 16.8 % — ABNORMAL HIGH (ref 11.5–15.5)
WBC: 14.5 10*3/uL — ABNORMAL HIGH (ref 4.0–10.5)
nRBC: 0.8 % — ABNORMAL HIGH (ref 0.0–0.2)

## 2020-11-29 LAB — MAGNESIUM: Magnesium: 2.1 mg/dL (ref 1.7–2.4)

## 2020-11-29 MED ORDER — FENTANYL CITRATE (PF) 100 MCG/2ML IJ SOLN
12.5000 ug | Freq: Once | INTRAMUSCULAR | Status: AC
Start: 2020-11-29 — End: 2020-11-29

## 2020-11-29 MED ORDER — FENTANYL CITRATE (PF) 100 MCG/2ML IJ SOLN
12.5000 ug | INTRAMUSCULAR | Status: DC | PRN
Start: 2020-11-29 — End: 2020-11-29
  Administered 2020-11-29: 12.5 ug via INTRAVENOUS
  Filled 2020-11-29: qty 2

## 2020-11-29 MED ORDER — FENTANYL CITRATE (PF) 100 MCG/2ML IJ SOLN
INTRAMUSCULAR | Status: AC
  Administered 2020-11-29: 12.5 ug via INTRAVENOUS
  Filled 2020-11-29: qty 2

## 2020-11-29 MED ORDER — HYDRALAZINE HCL 25 MG PO TABS
25.0000 mg | ORAL_TABLET | Freq: Three times a day (TID) | ORAL | Status: DC
Start: 2020-11-29 — End: 2020-12-01
  Administered 2020-11-29: 25 mg via ORAL
  Filled 2020-11-29 (×4): qty 1

## 2020-11-29 MED ORDER — ONDANSETRON HCL 4 MG/2ML IJ SOLN: 4.0000 mg | Freq: Four times a day (QID) | INTRAMUSCULAR | Status: DC | PRN

## 2020-11-29 NOTE — Care Plan (Signed)
Small drop in hemoglobin but no overt GI bleeding. Risk of any procedure outweighs any benefit currently. Please call with any questions.  Raylene Miyamoto MD, MPH [place]

## 2020-11-29 NOTE — Consult Note (Signed)
Consultation Note Date: 11/29/2020   Patient Name: Katie Graves  DOB: Jan 30, 1922  MRN: [id number]  Age / Sex: 87 y.o., female  PCP: Tracie Harrier, MD Referring Physician: Lorella Nimrod, MD  Reason for Consultation: Establishing goals of care, Hospice Evaluation and Psychosocial/spiritual support  HPI/Patient Profile: 87 y.o. female  with past medical history of HOH, HTN, DM, MDD, hx CVA/  ED 3/20 w fall/weak, hx melena.  hemorrhagic shock 2/2 GIB and acute renal failure admitted on 11/27/2020 with blood loss anemia/spinal fractures.   Clinical Assessment and Goals of Care: I have reviewed medical records including EPIC notes, labs and imaging, received report from bedside nursing staff, examined the patient.   Katie Graves is lying quietly in bed.  She looks quite frail and elderly.  She is able to tell me her name, but I am not sure that she is able to make her basic needs known.  There is no family at bedside at this time.  Call to daughter, Lillette Boxer to discuss diagnosis prognosis, [place], EOL wishes, disposition and options.  I introduced Palliative Medicine as specialized medical care for people living with serious illness. It focuses on providing relief from the symptoms and stress of a serious illness.    Nevin Bloodgood states that she would like to come in for a meeting, possibly tomorrow.  We briefly talk about transitioning to comfort care, and burdening Mrs. Defoor from treatments that are painful.  Nevin Bloodgood states that she cannot make any decisions without her brother. "I cant do this without my brother".  She states that her daughter, Mrs. Schnyder granddaughter, Mickel Baas is to go pick up Mrs. Cobbs son Charlotte Crumb.  All 3 will be present for the family meeting scheduled at 2 PM.  Pollice shares, "I want to let her go", but shares her concern over [place] and his acceptance that Mrs. Wooley is nearing end-of-life..    I arrived  in Katie Graves's room to find her much improved.  She is surrounded by family sharing their happiness over her improvements.  Although Mrs. Keech is more alert, able to take in liquids without overt signs and symptoms of aspiration, I shared that this does not change her current health status.  We talked about her blood loss anemia and the treatment plan, PPI twice daily and daily labs, and also her spinal fractures and pain management.  We discussed her current illness and what it means in the larger context of her on-going co-morbidities.  Natural disease trajectory and expectations at EOL were discussed.   Questions and concerns were addressed.  The family was encouraged to call with questions or concerns.   Conference with attending, bedside nursing staff, transition of care team related to patient condition, needs, goals of care. PMT to follow-up tomorrow.   HCPOA  HCPOA -daughter, Lillette Boxer and son, Delanie Tirrell.     SUMMARY OF RECOMMENDATIONS   At this point continue to treat the treatable but no CPR or intubation. Time for outcomes Anticipate need for short-term rehab  Code Status/Advance Care Planning:  DNR  Symptom Management:   Per hospitalist, no additional needs at this time.  Palliative Prophylaxis:   Frequent Pain Assessment, Oral Care and Turn Reposition  Additional Recommendations (Limitations, Scope, Preferences):  Continue to treat the treatable but no CPR or intubation.  Psycho-social/Spiritual:   Desire for further Chaplaincy support:no  Additional Recommendations: Caregiving  Support/Resources and Education on Hospice  Prognosis:   Unable to determine, based on outcomes.  Guarded at this time  Discharge Planning: To be determined, based on outcomes.  Anticipate need for short-term rehab      Primary Diagnoses: Present on Admission: . Acute blood loss anemia (ABLA)   I have reviewed the medical record, interviewed the patient and family, and  examined the patient. The following aspects are pertinent.  Past Medical History:  Diagnosis Date  . Aortic stenosis   . Atrophic vaginitis   . Back pain   . Cancer ([place])   . CVA (cerebral infarction)   . Diabetes mellitus without complication ([place])   . Gastritis   . Hypertension   . Seasonal allergies   . Stroke [facility])    TIA x3  . Vertigo    Social History   Socioeconomic History  . Marital status: Widowed    Spouse name: Not on file  . Number of children: Not on file  . Years of education: Not on file  . Highest education level: Not on file  Occupational History  . Not on file  Tobacco Use  . Smoking status: Never Smoker  . Smokeless tobacco: Never Used  Substance and Sexual Activity  . Alcohol use: No    Alcohol/week: 0.0 standard drinks  . Drug use: No  . Sexual activity: Not on file  Other Topics Concern  . Not on file  Social History Narrative  . Not on file   Social Determinants of Health   Financial Resource Strain: Not on file  Food Insecurity: Not on file  Transportation Needs: Not on file  Physical Activity: Not on file  Stress: Not on file  Social Connections: Not on file   Family History  Problem Relation Age of Onset  . Hypertension Other   . Bladder Cancer Neg Hx   . Kidney disease Neg Hx    Scheduled Meds: . Chlorhexidine Gluconate Cloth  6 each Topical Daily  . insulin aspart  0-9 Units Subcutaneous Q4H  . lidocaine  1 patch Transdermal Q24H  . mouth rinse  15 mL Mouth Rinse BID  . pantoprazole (PROTONIX) IV  40 mg Intravenous Q12H   Continuous Infusions: . sodium chloride 20 mL/hr at 11/29/20 0800   PRN Meds:.fentaNYL (SUBLIMAZE) injection Medications Prior to Admission:  Prior to Admission medications   Medication Sig Start Date End Date Taking? Authorizing Provider  clopidogrel (PLAVIX) 75 MG tablet Take 75 mg by mouth every morning. 05/26/15  Yes [provider]  furosemide (LASIX) 20 MG tablet Take 20 mg by mouth  daily. 10/31/20  Yes [provider]  glimepiride (AMARYL) 2 MG tablet Take 2 mg by mouth daily with breakfast.   Yes [provider]  hydrALAZINE (APRESOLINE) 25 MG tablet Take 25 mg by mouth 3 (three) times daily. 06/07/15  Yes [provider]  losartan (COZAAR) 100 MG tablet Take 100 mg by mouth daily. 06/03/15  Yes [provider]  Multiple Vitamins-Minerals (MULTIVITAMIN WITH MINERALS) tablet Take 1 tablet by mouth daily.   Yes [provider]  verapamil (CALAN-SR) 120 MG  CR tablet Take 180 mg by mouth at bedtime.   Yes [provider]   Allergies  Allergen Reactions  . Lidocaine Nausea Only    Other reaction(s): Unknown  . Novocain [Procaine] Nausea Only  . Penicillins     Has patient had a PCN reaction causing immediate rash, facial/tongue/throat swelling, SOB or lightheadedness with hypotension: no Has patient had a PCN reaction causing severe rash involving mucus membranes or skin necrosis: yes Has patient had a PCN reaction that required hospitalization no Has patient had a PCN reaction occurring within the last 10 years: no If all of the above answers are "NO", then may proceed with Cephalosporin use.   . Sulfa Antibiotics Nausea Only  . Vancomycin Nausea Only    Other reaction(s): Unknown  . Codeine Rash   Review of Systems  Unable to perform ROS: Acuity of condition    Physical Exam Vitals and nursing note reviewed.  Constitutional:      Appearance: She is ill-appearing.  Cardiovascular:     Rate and Rhythm: Normal rate.  Pulmonary:     Effort: Pulmonary effort is normal. No respiratory distress.  Musculoskeletal:        General: No swelling.  Skin:    General: Skin is warm and dry.  Neurological:     Comments: Do not ask orientation questions     Vital Signs: BP (!) 149/84   Pulse 83   Temp 98.8 F (37.1 C) (Oral)   Resp 17   Ht 5\' 7"  (1.702 m)   Wt 56.7 kg   SpO2 97%   BMI 19.58 kg/m  Pain Scale:  PAINAD POSS *See Group Information*: 2-Acceptable,Slightly drowsy, easily aroused Pain Score: Asleep   SpO2: SpO2: 97 % O2 Device:SpO2: 97 % O2 Flow Rate: .   IO: Intake/output summary:   Intake/Output Summary (Last 24 hours) at 11/29/2020 1139 Last data filed at 11/29/2020 1000 Gross per 24 hour  Intake 1279.48 ml  Output 510 ml  Net 769.48 ml    LBM: Last BM Date: 11/25/20 Baseline Weight: Weight: 60 kg Most recent weight: Weight: 56.7 kg     Palliative Assessment/Data:   Flowsheet Rows   Flowsheet Row Most Recent Value  Intake Tab   Referral Department Hospitalist  Unit at Time of Referral Med/Surg Unit  Palliative Care Primary Diagnosis Trauma  Date Notified 11/28/20  Palliative Care Type New Palliative care  Reason for referral Clarify Goals of Care  Date of Admission 11/27/20  Date first seen by Palliative Care 11/29/20  # of days Palliative referral response time 1 Day(s)  # of days IP prior to Palliative referral 1  Clinical Assessment   Palliative Performance Scale Score 20%  Pain Max last 24 hours Not able to report  Pain Min Last 24 hours Not able to report  Dyspnea Max Last 24 Hours Not able to report  Dyspnea Min Last 24 hours Not able to report  Psychosocial & Spiritual Assessment   Palliative Care Outcomes       Time In: 1320 Time Out: 1430 Time Total: 70 minutes  Greater than 50%  of this time was spent counseling and coordinating care related to the above assessment and plan.  Signed by: Drue Novel, NP   Please contact Palliative Medicine Team phone at [phone] for questions and concerns.  For individual provider: See Shea Evans

## 2020-11-29 NOTE — Progress Notes (Addendum)
PROGRESS NOTE  ICU transfer  TOCARRA GASSEN  UPJ:[id number] DOB: 08-24-1922 DOA: 11/27/2020 PCP: Tracie Harrier, MD   Brief Narrative: Taken from prior notes. 87 year old female with previous medical history of bilateral sensorineural hearing loss, essential hypertension, diabetes, major depressive disorder, history of CVA with basilar artery syndrome came in from home after family noted malaise fatigue with inability to walk post melanotic stools several times over the last few days starting Friday.  Patient came in profoundly hypotensive found to have severe anemia hemoglobin 4 with most recent at closer to 11 with circulatory shock.  Additional findings include AKI, transaminitis, altered mental status with confusion. Admitted with hemorrhagic shock, initially required pressors and received 5 unit of PRBCs. Hemoglobin improved to 13.4 and now again trending down, continue to have worsening renal function.  Off the pressors now with elevated blood pressure, palliative care was also consulted and try it was asked to resume care from today.  Subjective: Patient was just moaning and stating no for all the questions when seen today.  She was unable to answer any questions appropriately.  Very hard of hearing.  Assessment & Plan:   Active Problems:   Acute blood loss anemia (ABLA)  Acute blood loss anemia.  Patient did not had any more BM per nursing staff.  Hemoglobin improved to 13.4 and decreased to 11.9 this morning.  No other obvious bleeding. Patient received total of 5 units of PRBC.  Baseline hemoglobin around 11. GI was also consulted-no plan for any EGD as risk outweighs the benefit at this time. -Continue holding home dose of aspirin and Plavix-patient should be taken off as very little benefit in this age group. -Monitor hemoglobin -Transfuse if below 8 -Continue with twice daily IV Protonix -Palliative care with family meeting today-family wants to continue current level of  care.  Elevated troponin.  Patient denies any chest pain with troponin more than 27,000.  EKG with ST depression in inferolateral leads with the setting of hemorrhagic shock. As family is not ready to proceed for comfort care only at this time we will involve cardiology. She is not appropriate for any aggressive measures.  Rhabdomyolysis.  CK elevated at 1100.  Might be contributory to AKI. -Monitor CK  Dysphagia.  Apparently they chronic issue with recurrent regurgitation. GI does not want any procedure. -Conservative management. -Swallow team to help decide about the diet preference.  AKI with CKD stage IIIb.  Most likely secondary to hemorrhagic shock. Renal function with some mild worsening.  Baseline creatinine around 1.4-1.6.  Today it was 3.94 with BUN of 135, bicarb of 18. -Monitor renal function.  Leukocytosis.  Most likely reactive secondary to hemorrhagic shock.  Improving.  No obvious source of infection and blood cultures remain negative. -Continue to monitor  Transaminitis.  Most likely secondary to shock liver with hemorrhagic shock. -Continue to monitor  Hypertension.  Blood pressure elevated now.  On multiple antihypertensives at home which includes losartan, verapamil, Lasix and hydralazine. -Restart home dose of hydralazine. -Keep holding Lasix and losartan due to AKI. -Will add verapamil if needed.  Type 2 diabetes mellitus.  Patient was on Amaryl at home. -SSI for now.  Protein caloric malnutrition.  Patient appears malnourished with mildly decreased total protein and albumin. Estimated body mass index is 19.58 kg/m as calculated from the following:   Height as of this encounter: 5\' 7"  (1.702 m).   Weight as of this encounter: 56.7 kg.  -Dietitian consult  Patient is very high risk for  deterioration and death secondary to advanced age, multiple comorbidities and worsening renal function at this time.  Not a suitable candidate for any aggressive  measures.  Objective: Vitals:   11/29/20 1100 11/29/20 1200 11/29/20 1300 11/29/20 1400  BP: (!) 138/93 (!) 152/85 (!) 155/81 (!) 174/85  Pulse: 82 81 82 81  Resp: 17 15 20 20   Temp:  97.6 F (36.4 C)    TempSrc:  Axillary    SpO2: 95% 98% 97% 98%  Weight:      Height:        Intake/Output Summary (Last 24 hours) at 11/29/2020 1416 Last data filed at 11/29/2020 1200 Gross per 24 hour  Intake 959.84 ml  Output 510 ml  Net 449.84 ml   Filed Weights   11/27/20 1228 11/28/20 0500  Weight: 60 kg 56.7 kg    Examination:  General exam: Frail and chronically ill-appearing elderly lady.  Very hard of hearing Respiratory system: Clear to auscultation. Respiratory effort normal. Cardiovascular system: S1 & S2 heard, RRR.  Positive murmur Gastrointestinal system: Soft, nontender, nondistended, bowel sounds positive. Central nervous system: Very hard of hearing and unable to answer appropriately. Extremities: No edema, no cyanosis, pulses intact and symmetrical. Psychiatry: Judgement and insight appear impaired.  DVT prophylaxis: SCDs Code Status: DNR Family Communication: Multiple family members with palliative care meeting. Disposition Plan:  Status is: Inpatient  Remains inpatient appropriate because:Inpatient level of care appropriate due to severity of illness   Dispo: The patient is from: Home              Anticipated d/c is to: Home              Patient currently is not medically stable to d/c.   Difficult to place patient No              Level of care: Med-Surg  All the records are reviewed and case discussed with Care Management/Social Worker. Management plans discussed with the patient, nursing and they are in agreement.  Consultants:   PCCM  GI  Procedures:  Antimicrobials:   Data Reviewed: I have personally reviewed following labs and imaging studies  CBC: Recent Labs  Lab 11/27/20 1236 11/27/20 1907 11/27/20 2356 11/28/20 0436 11/29/20 0348   WBC 19.3*  --   --  17.8* 14.5*  NEUTROABS 16.9*  --   --   --   --   HGB 4.8* 13.1 12.5 13.4 11.9*  HCT 16.0* 38.3 37.4 40.5 35.2*  MCV 79.6*  --   --  84.0 83.4  PLT 277  --   --  209 009*   Basic Metabolic Panel: Recent Labs  Lab 11/27/20 1236 11/28/20 0436 11/29/20 0348  NA 139 138 144  K 4.3 4.0 3.8  CL 103 105 113*  CO2 11* 17* 18*  GLUCOSE 155* 272* 147*  BUN 108* 107* 135*  CREATININE 3.40* 3.48* 3.94*  CALCIUM 9.1 8.3* 8.1*  MG  --  2.2 2.1  PHOS  --  7.2* 6.4*   GFR: Estimated Creatinine Clearance: 7.3 mL/min (A) (by C-G formula based on SCr of 3.94 mg/dL (H)). Liver Function Tests: Recent Labs  Lab 11/27/20 1236 11/28/20 0436 11/29/20 0348  AST 499* 909*  --   ALT 195* 555*  --   ALKPHOS 70 85  --   BILITOT 0.7 0.9  --   PROT 5.6* 6.0*  --   ALBUMIN 3.1* 3.3* 2.7*   No results for input(s): LIPASE, AMYLASE in the  last 168 hours. No results for input(s): AMMONIA in the last 168 hours. Coagulation Profile: No results for input(s): INR, PROTIME in the last 168 hours. Cardiac Enzymes: Recent Labs  Lab 11/28/20 0436  CKTOTAL 1,136*   BNP (last 3 results) No results for input(s): PROBNP in the last 8760 hours. HbA1C: No results for input(s): HGBA1C in the last 72 hours. CBG: Recent Labs  Lab 11/28/20 1923 11/28/20 2335 11/29/20 0402 11/29/20 0722 11/29/20 1131  GLUCAP 122* 134* 140* 122* 110*   Lipid Profile: No results for input(s): CHOL, HDL, LDLCALC, TRIG, CHOLHDL, LDLDIRECT in the last 72 hours. Thyroid Function Tests: No results for input(s): TSH, T4TOTAL, FREET4, T3FREE, THYROIDAB in the last 72 hours. Anemia Panel: No results for input(s): VITAMINB12, FOLATE, FERRITIN, TIBC, IRON, RETICCTPCT in the last 72 hours. Sepsis Labs: Recent Labs  Lab 11/27/20 1236 11/27/20 1701 11/28/20 0436  LATICACIDVEN >11.0* 6.0* 2.4*    Recent Results (from the past 240 hour(s))  Culture, blood (routine x 2)     Status: None (Preliminary  result)   Collection Time: 11/27/20 12:36 PM   Specimen: BLOOD  Result Value Ref Range Status   Specimen Description BLOOD RIGHT ANTECUBITAL  Final   Special Requests   Final    BOTTLES DRAWN AEROBIC AND ANAEROBIC Blood Culture results may not be optimal due to an inadequate volume of blood received in culture bottles   Culture   Final    NO GROWTH 2 DAYS Performed at [facility], [address] [place], [place] [address]    Report Status PENDING  Incomplete  Culture, blood (routine x 2)     Status: None (Preliminary result)   Collection Time: 11/27/20 12:39 PM   Specimen: BLOOD  Result Value Ref Range Status   Specimen Description BLOOD LEFT ANTECUBITAL  Final   Special Requests   Final    BOTTLES DRAWN AEROBIC AND ANAEROBIC Blood Culture adequate volume   Culture   Final    NO GROWTH 2 DAYS Performed at [facility], [address], [place], [place] [address]    Report Status PENDING  Incomplete  Resp Panel by RT-PCR (Flu A&B, Covid) Nasopharyngeal Swab     Status: None   Collection Time: 11/27/20 12:45 PM   Specimen: Nasopharyngeal Swab; Nasopharyngeal(NP) swabs in vial transport medium  Result Value Ref Range Status   SARS Coronavirus 2 by RT PCR NEGATIVE NEGATIVE Final    Comment: (NOTE) SARS-CoV-2 target nucleic acids are NOT DETECTED.  The SARS-CoV-2 RNA is generally detectable in upper respiratory specimens during the acute phase of infection. The lowest concentration of SARS-CoV-2 viral copies this assay can detect is 138 copies/mL. A negative result does not preclude SARS-Cov-2 infection and should not be used as the sole basis for treatment or other patient management decisions. A negative result may occur with  improper specimen collection/handling, submission of specimen other than nasopharyngeal swab, presence of viral mutation(s) within the areas targeted by this assay, and inadequate number of viral copies(<138 copies/mL). A negative  result must be combined with clinical observations, patient history, and epidemiological information. The expected result is Negative.  Fact Sheet for Patients:  EntrepreneurPulse.com.au  Fact Sheet for Healthcare Providers:  IncredibleEmployment.be  This test is no t yet approved or cleared by the [place] FDA and  has been authorized for detection and/or diagnosis of SARS-CoV-2 by FDA under an Emergency Use Authorization (EUA). This EUA will remain  in effect (meaning this test can be used)  for the duration of the COVID-19 declaration under Section 564(b)(1) of the Act, 21 U.S.C.section 360bbb-3(b)(1), unless the authorization is terminated  or revoked sooner.       Influenza A by PCR NEGATIVE NEGATIVE Final   Influenza B by PCR NEGATIVE NEGATIVE Final    Comment: (NOTE) The Xpert Xpress SARS-CoV-2/FLU/RSV plus assay is intended as an aid in the diagnosis of influenza from Nasopharyngeal swab specimens and should not be used as a sole basis for treatment. Nasal washings and aspirates are unacceptable for Xpert Xpress SARS-CoV-2/FLU/RSV testing.  Fact Sheet for Patients: EntrepreneurPulse.com.au  Fact Sheet for Healthcare Providers: IncredibleEmployment.be  This test is not yet approved or cleared by the [place] FDA and has been authorized for detection and/or diagnosis of SARS-CoV-2 by FDA under an Emergency Use Authorization (EUA). This EUA will remain in effect (meaning this test can be used) for the duration of the COVID-19 declaration under Section 564(b)(1) of the Act, 21 U.S.C. section 360bbb-3(b)(1), unless the authorization is terminated or revoked.  Performed at [facility], [address] [place], [place] [address]   MRSA PCR Screening     Status: None   Collection Time: 11/27/20  5:48 PM   Specimen: Nasopharyngeal  Result Value Ref Range Status   MRSA by PCR  NEGATIVE NEGATIVE Final    Comment:        The GeneXpert MRSA Assay (FDA approved for NASAL specimens only), is one component of a comprehensive MRSA colonization surveillance program. It is not intended to diagnose MRSA infection nor to guide or monitor treatment for MRSA infections. Performed at [facility], [address] [place], [place] [address]      Radiology Studies: CT HEAD WO CONTRAST  Result Date: 11/28/2020 CLINICAL DATA:  Head trauma, loss of consciousness. Additional provided: Hemorrhagic shock. EXAM: CT HEAD WITHOUT CONTRAST TECHNIQUE: Contiguous axial images were obtained from the base of the skull through the vertex without intravenous contrast. COMPARISON:  Brain MRI 06/10/2019. CT angiogram head/neck 06/19/2015. Head CT 06/19/2013. FINDINGS: Brain: Moderate to moderately advanced cerebral atrophy. Comparatively mild cerebellar atrophy. Known chronic small vessel infarcts within the bilateral cerebral white matter, right basal ganglia, left thalamus, right thalamocapsular junction and left pons. Additional small chronic infarcts within the bilateral cerebellar hemispheres Background ill-defined hypoattenuation within the cerebral white matter is nonspecific, but compatible with chronic small vessel ischemic disease. There is no acute intracranial hemorrhage. No demarcated cortical infarct. No extra-axial fluid collection. No evidence of intracranial mass. No midline shift. Vascular: No hyperdense vessel.  Atherosclerotic calcifications. Skull: Normal. Negative for fracture or focal lesion. Sinuses/Orbits: Visualized orbits show no acute finding. Trace bilateral ethmoid sinus mucosal thickening. Small right mastoid effusion. Other: Anterior subluxation of the mandibular condyles bilaterally. IMPRESSION: No evidence of acute intracranial abnormality. Stable parenchymal atrophy and chronic small vessel ischemic disease. Redemonstrated chronic infarcts within the bilateral  cerebral white matter, right basal ganglia, left thalamus, right thalamocapsular junction and left pons. Additional known chronic infarcts within the bilateral cerebellar hemispheres. Anterior subluxation of the mandibular condyles bilaterally, which may be related to patient positioning at the time of the examination. Correlate with physical exam to exclude TMJ dislocation. Small right mastoid effusion. Electronically Signed   By: Kellie Simmering DO   On: 11/28/2020 19:44   CT CHEST ABDOMEN PELVIS WO CONTRAST  Result Date: 11/27/2020 CLINICAL DATA:  Mid-back pain fall history with ongoing back pain EXAM: CT CHEST, ABDOMEN AND PELVIS WITHOUT CONTRAST TECHNIQUE: Multidetector CT imaging of the  chest, abdomen and pelvis was performed following the standard protocol without IV contrast. COMPARISON:  CT abdomen pelvis 01/28/2017 FINDINGS: CT CHEST FINDINGS Cardiovascular: Enlarged left atrium. Likely enlarged left ventricle. Severe aortic valve leaflet calcifications. Severe mitral annular calcifications. No significant pericardial effusion. The thoracic aorta is normal in caliber. Severe atherosclerotic plaque of the thoracic aorta. Mild to moderate three-vessel coronary artery calcifications. Mediastinum/Nodes: No enlarged mediastinal or axillary lymph nodes. No gross hilar adenopathy, noting limited sensitivity for the detection of hilar adenopathy on this noncontrast study. There is a 1.1 cm hypodense lesion within the right thyroid gland. Trachea demonstrate no significant findings. Question esophageal wall thickening with limited evaluation on this noncontrast study. Air-fluid level within the esophageal lumen. Lungs/Pleura: No focal consolidation. No pulmonary nodule. No pulmonary mass. Subsegmental atelectasis appear bilateral trace pleural effusions. No pneumothorax. Musculoskeletal: No chest wall abnormality. No suspicious lytic or blastic osseous lesions. No acute displaced rib or sternal fracture. See below  regarding spinal fractures. CT ABDOMEN PELVIS FINDINGS Hepatobiliary: No focal liver abnormality. No gallstones, gallbladder wall thickening, or pericholecystic fluid. No biliary dilatation. Pancreas: Diffusely atrophic. No focal lesion. Otherwise normal pancreatic contour. No surrounding inflammatory changes. No main pancreatic ductal dilatation. Spleen: Normal in size without focal abnormality. Adrenals/Urinary Tract: Hyperplasia of bilateral adrenal glands with question adenoma on within the left right. Bilateral renal cortical scarring. Subcentimeter hyperdense right renal lesion (2:60). No nephrolithiasis, no hydronephrosis, and no contour-deforming renal mass. No ureterolithiasis or hydroureter. The urinary bladder is unremarkable. Stomach/Bowel: Ascending colon surgical changes. Stomach is within normal limits. No evidence of bowel wall thickening or dilatation. Diffuse descending colon and sigmoid diverticulosis. Appendix likely surgically removed. Vascular/Lymphatic: No abdominal aorta or iliac aneurysm. Severe atherosclerotic plaque of the aorta and its branches. Prominent but nonenlarged retroperitoneal lymph nodes. No abdominal, pelvic, or inguinal lymphadenopathy. Reproductive: Status post hysterectomy. No adnexal masses. Other: No intraperitoneal free fluid. No intraperitoneal free gas. No organized fluid collection. Musculoskeletal: Diffuse mild subcutaneus soft tissue edema no abdominal wall hernia or abnormality. No suspicious lytic or blastic osseous lesions. No acute displaced fracture. Multilevel degenerative changes of the spine. Grade 1 anterolisthesis of L4 on L5. Pubic symphysis degenerative changes. Age-indeterminate, possibly chronic, T9 compression fracture with greater than 35% height loss. Interval development of a a T12 and L1 compression fracture. Greater than a year 60% height loss at the T12 level and greater than 5% height loss at the L1 level. No retropulsion into the central  canal. IMPRESSION: 1. Likely acute T12 and L1 compression fractures. Age-indeterminate T9 compression fracture. Correlate with midline point tenderness to evaluate for acuity. 2. Otherwise no acute intrathoracic, intra-abdominal, intrapelvic abnormality with limited evaluation on this noncontrast study. 3. Question esophageal wall thickening with limited evaluation on this noncontrast study. 4. Other imaging findings of potential clinical significance: Trace bilateral pleural effusions. Colonic diverticulosis with no acute diverticulitis. Indetemrinate subcentimeter hyperdense right renal lesion. Severe aortic valve leaflet and mitral annular calcifications. Aortic Atherosclerosis (ICD10-I70.0) including coronary artery calcifications. Electronically Signed   By: Iven Finn M.D.   On: 11/27/2020 23:21    Scheduled Meds: . Chlorhexidine Gluconate Cloth  6 each Topical Daily  . insulin aspart  0-9 Units Subcutaneous Q4H  . lidocaine  1 patch Transdermal Q24H  . mouth rinse  15 mL Mouth Rinse BID  . pantoprazole (PROTONIX) IV  40 mg Intravenous Q12H   Continuous Infusions: . sodium chloride 20 mL/hr at 11/29/20 1200     LOS: 2 days   Time spent:  40 minutes. More than 50% of the time was spent in counseling/coordination of care  Lorella Nimrod, MD [facility]  If 7PM-7AM, please contact night-coverage [email or web address]  11/29/2020, 2:16 PM   This record has been created using Systems analyst. Errors have been sought and corrected,but may not always be located. Such creation errors do not reflect on the standard of care.

## 2020-11-29 NOTE — Progress Notes (Signed)
Prayer for patient with her  personal church pastort

## 2020-11-29 NOTE — Progress Notes (Signed)
No changes from previous assessment at midnight

## 2020-11-29 NOTE — Hospital Course (Signed)
Cardiology Consultation Note    Patient ID: Katie Graves, MRN: [id number], DOB/AGE: July 13, 1922 87 y.o. Admit date: 11/27/2020   Date of Consult: 11/29/2020 Primary Physician: Tracie Harrier, MD Primary Cardiologist: ***  Chief Complaint: *** Reason for Consultation: *** Requesting MD: ***  HPI: Katie Graves is a 87 y.o. female with history of ***  Past Medical History:  Diagnosis Date   Aortic stenosis    Atrophic vaginitis    Back pain    Cancer ([place])    CVA (cerebral infarction)    Diabetes mellitus without complication ([place])    Gastritis    Hypertension    Seasonal allergies    Stroke ([place])    TIA x3   Vertigo       Surgical History:  Past Surgical History:  Procedure Laterality Date   ABDOMINAL HYSTERECTOMY     BACK SURGERY     BREAST BIOPSY Left    stereotactic biopsy   COLON SURGERY     TONSILLECTOMY       Home Meds: Prior to Admission medications   Medication Sig Start Date End Date Taking? Authorizing Provider  clopidogrel (PLAVIX) 75 MG tablet Take 75 mg by mouth every morning. 05/26/15  Yes [provider]  furosemide (LASIX) 20 MG tablet Take 20 mg by mouth daily. 10/31/20  Yes [provider]  glimepiride (AMARYL) 2 MG tablet Take 2 mg by mouth daily with breakfast.   Yes [provider]  hydrALAZINE (APRESOLINE) 25 MG tablet Take 25 mg by mouth 3 (three) times daily. 06/07/15  Yes [provider]  losartan (COZAAR) 100 MG tablet Take 100 mg by mouth daily. 06/03/15  Yes [provider]  Multiple Vitamins-Minerals (MULTIVITAMIN WITH MINERALS) tablet Take 1 tablet by mouth daily.   Yes [provider]  verapamil (CALAN-SR) 120 MG CR tablet Take 180 mg by mouth at bedtime.   Yes [provider]    Inpatient Medications:   Chlorhexidine Gluconate Cloth  6 each Topical Daily   hydrALAZINE  25 mg Oral TID   insulin aspart  0-9 Units Subcutaneous Q4H   lidocaine  1 patch Transdermal  Q24H   mouth rinse  15 mL Mouth Rinse BID   pantoprazole (PROTONIX) IV  40 mg Intravenous Q12H    sodium chloride 20 mL/hr at 11/29/20 1200    Allergies:  Allergies  Allergen Reactions   Lidocaine Nausea Only    Other reaction(s): Unknown   Novocain [Procaine] Nausea Only   Penicillins     Has patient had a PCN reaction causing immediate rash, facial/tongue/throat swelling, SOB or lightheadedness with hypotension: no Has patient had a PCN reaction causing severe rash involving mucus membranes or skin necrosis: yes Has patient had a PCN reaction that required hospitalization no Has patient had a PCN reaction occurring within the last 10 years: no If all of the above answers are "NO", then may proceed with Cephalosporin use.    Sulfa Antibiotics Nausea Only   Vancomycin Nausea Only    Other reaction(s): Unknown   Codeine Rash    Social History   Socioeconomic History   Marital status: Widowed    Spouse name: Not on file   Number of children: Not on file   Years of education: Not on file   Highest education level: Not on file  Occupational History   Not on file  Tobacco Use   Smoking status: Never Smoker   Smokeless tobacco: Never Used  Substance and  Sexual Activity   Alcohol use: No    Alcohol/week: 0.0 standard drinks   Drug use: No   Sexual activity: Not on file  Other Topics Concern   Not on file  Social History Narrative   Not on file   Social Determinants of Health   Financial Resource Strain: Not on file  Food Insecurity: Not on file  Transportation Needs: Not on file  Physical Activity: Not on file  Stress: Not on file  Social Connections: Not on file  Intimate Partner Violence: Not on file     Family History  Problem Relation Age of Onset   Hypertension Other    Bladder Cancer Neg Hx    Kidney disease Neg Hx      Review of Systems: A 12-system review of systems was performed and is negative except as noted in the HPI.  Labs: Recent Labs     11/28/20 0436  CKTOTAL 1,136*   Lab Results  Component Value Date   WBC 14.5 (H) 11/29/2020   HGB 11.9 (L) 11/29/2020   HCT 35.2 (L) 11/29/2020   MCV 83.4 11/29/2020   PLT 141 (L) 11/29/2020    Recent Labs  Lab 11/28/20 0436 11/29/20 0348  NA 138 144  K 4.0 3.8  CL 105 113*  CO2 17* 18*  BUN 107* 135*  CREATININE 3.48* 3.94*  CALCIUM 8.3* 8.1*  PROT 6.0*  --   BILITOT 0.9  --   ALKPHOS 85  --   ALT 555*  --   AST 909*  --   GLUCOSE 272* 147*   No results found for: CHOL, HDL, LDLCALC, TRIG No results found for: DDIMER  Radiology/Studies:  CT HEAD WO CONTRAST  Result Date: 11/28/2020 CLINICAL DATA:  Head trauma, loss of consciousness. Additional provided: Hemorrhagic shock. EXAM: CT HEAD WITHOUT CONTRAST TECHNIQUE: Contiguous axial images were obtained from the base of the skull through the vertex without intravenous contrast. COMPARISON:  Brain MRI 06/10/2019. CT angiogram head/neck 06/19/2015. Head CT 06/19/2013. FINDINGS: Brain: Moderate to moderately advanced cerebral atrophy. Comparatively mild cerebellar atrophy. Known chronic small vessel infarcts within the bilateral cerebral white matter, right basal ganglia, left thalamus, right thalamocapsular junction and left pons. Additional small chronic infarcts within the bilateral cerebellar hemispheres Background ill-defined hypoattenuation within the cerebral white matter is nonspecific, but compatible with chronic small vessel ischemic disease. There is no acute intracranial hemorrhage. No demarcated cortical infarct. No extra-axial fluid collection. No evidence of intracranial mass. No midline shift. Vascular: No hyperdense vessel.  Atherosclerotic calcifications. Skull: Normal. Negative for fracture or focal lesion. Sinuses/Orbits: Visualized orbits show no acute finding. Trace bilateral ethmoid sinus mucosal thickening. Small right mastoid effusion. Other: Anterior subluxation of the mandibular condyles bilaterally.  IMPRESSION: No evidence of acute intracranial abnormality. Stable parenchymal atrophy and chronic small vessel ischemic disease. Redemonstrated chronic infarcts within the bilateral cerebral white matter, right basal ganglia, left thalamus, right thalamocapsular junction and left pons. Additional known chronic infarcts within the bilateral cerebellar hemispheres. Anterior subluxation of the mandibular condyles bilaterally, which may be related to patient positioning at the time of the examination. Correlate with physical exam to exclude TMJ dislocation. Small right mastoid effusion. Electronically Signed   By: Kellie Simmering DO   On: 11/28/2020 19:44   DG Chest Portable 1 View  Result Date: 11/27/2020 CLINICAL DATA:  Multiple falls and weakness for 2 days. EXAM: PORTABLE CHEST 1 VIEW COMPARISON:  Chest radiograph dated 11/05/2016. FINDINGS: The heart size is normal. Vascular calcifications are  seen in the aortic arch. Both lungs are clear. Degenerative changes are seen in the spine. IMPRESSION: No active disease. Electronically Signed   By: Zerita Boers M.D.   On: 11/27/2020 13:41   CT CHEST ABDOMEN PELVIS WO CONTRAST  Result Date: 11/27/2020 CLINICAL DATA:  Mid-back pain fall history with ongoing back pain EXAM: CT CHEST, ABDOMEN AND PELVIS WITHOUT CONTRAST TECHNIQUE: Multidetector CT imaging of the chest, abdomen and pelvis was performed following the standard protocol without IV contrast. COMPARISON:  CT abdomen pelvis 01/28/2017 FINDINGS: CT CHEST FINDINGS Cardiovascular: Enlarged left atrium. Likely enlarged left ventricle. Severe aortic valve leaflet calcifications. Severe mitral annular calcifications. No significant pericardial effusion. The thoracic aorta is normal in caliber. Severe atherosclerotic plaque of the thoracic aorta. Mild to moderate three-vessel coronary artery calcifications. Mediastinum/Nodes: No enlarged mediastinal or axillary lymph nodes. No gross hilar adenopathy, noting limited  sensitivity for the detection of hilar adenopathy on this noncontrast study. There is a 1.1 cm hypodense lesion within the right thyroid gland. Trachea demonstrate no significant findings. Question esophageal wall thickening with limited evaluation on this noncontrast study. Air-fluid level within the esophageal lumen. Lungs/Pleura: No focal consolidation. No pulmonary nodule. No pulmonary mass. Subsegmental atelectasis appear bilateral trace pleural effusions. No pneumothorax. Musculoskeletal: No chest wall abnormality. No suspicious lytic or blastic osseous lesions. No acute displaced rib or sternal fracture. See below regarding spinal fractures. CT ABDOMEN PELVIS FINDINGS Hepatobiliary: No focal liver abnormality. No gallstones, gallbladder wall thickening, or pericholecystic fluid. No biliary dilatation. Pancreas: Diffusely atrophic. No focal lesion. Otherwise normal pancreatic contour. No surrounding inflammatory changes. No main pancreatic ductal dilatation. Spleen: Normal in size without focal abnormality. Adrenals/Urinary Tract: Hyperplasia of bilateral adrenal glands with question adenoma on within the left right. Bilateral renal cortical scarring. Subcentimeter hyperdense right renal lesion (2:60). No nephrolithiasis, no hydronephrosis, and no contour-deforming renal mass. No ureterolithiasis or hydroureter. The urinary bladder is unremarkable. Stomach/Bowel: Ascending colon surgical changes. Stomach is within normal limits. No evidence of bowel wall thickening or dilatation. Diffuse descending colon and sigmoid diverticulosis. Appendix likely surgically removed. Vascular/Lymphatic: No abdominal aorta or iliac aneurysm. Severe atherosclerotic plaque of the aorta and its branches. Prominent but nonenlarged retroperitoneal lymph nodes. No abdominal, pelvic, or inguinal lymphadenopathy. Reproductive: Status post hysterectomy. No adnexal masses. Other: No intraperitoneal free fluid. No intraperitoneal free  gas. No organized fluid collection. Musculoskeletal: Diffuse mild subcutaneus soft tissue edema no abdominal wall hernia or abnormality. No suspicious lytic or blastic osseous lesions. No acute displaced fracture. Multilevel degenerative changes of the spine. Grade 1 anterolisthesis of L4 on L5. Pubic symphysis degenerative changes. Age-indeterminate, possibly chronic, T9 compression fracture with greater than 35% height loss. Interval development of a a T12 and L1 compression fracture. Greater than a year 60% height loss at the T12 level and greater than 5% height loss at the L1 level. No retropulsion into the central canal. IMPRESSION: 1. Likely acute T12 and L1 compression fractures. Age-indeterminate T9 compression fracture. Correlate with midline point tenderness to evaluate for acuity. 2. Otherwise no acute intrathoracic, intra-abdominal, intrapelvic abnormality with limited evaluation on this noncontrast study. 3. Question esophageal wall thickening with limited evaluation on this noncontrast study. 4. Other imaging findings of potential clinical significance: Trace bilateral pleural effusions. Colonic diverticulosis with no acute diverticulitis. Indetemrinate subcentimeter hyperdense right renal lesion. Severe aortic valve leaflet and mitral annular calcifications. Aortic Atherosclerosis (ICD10-I70.0) including coronary artery calcifications. Electronically Signed   By: Iven Finn M.D.   On: 11/27/2020 23:21  Wt Readings from Last 3 Encounters:  11/28/20 56.7 kg  08/09/17 67.1 kg  02/01/17 68.5 kg    EKG: ***  Physical Exam:*** Blood pressure (!) 146/73, pulse 84, temperature 98.4 F (36.9 C), temperature source Oral, resp. rate (!) 22, height 5\' 7"  (1.702 m), weight 56.7 kg, SpO2 97 %. Body mass index is 19.58 kg/m. General: Well developed, well nourished, in no acute distress. Head: Normocephalic, atraumatic, sclera non-icteric, no xanthomas, nares are without discharge.  Neck:  Negative for carotid bruits. JVD not elevated. Lungs: Clear bilaterally to auscultation without wheezes, rales, or rhonchi. Breathing is unlabored. Heart: RRR with S1 S2. No murmurs, rubs, or gallops appreciated. Abdomen: Soft, non-tender, non-distended with normoactive bowel sounds. No hepatomegaly. No rebound/guarding. No obvious abdominal masses. Msk:  Strength and tone appear normal for age. Extremities: No clubbing or cyanosis. No edema.  Distal pedal pulses are 2+ and equal bilaterally. Neuro: Alert and oriented X 3. No facial asymmetry. No focal deficit. Moves all extremities spontaneously. Psych:  Responds to questions appropriately with a normal affect.     Assessment and Plan  ***  Signed, Teodoro Spray MD 11/29/2020, 4:49 PM Pager: [phone]

## 2020-11-29 NOTE — Consult Note (Signed)
[place] [facility] for Electrolyte Monitoring and Replacement   Recent Labs: Potassium (mmol/L)  Date Value  11/29/2020 3.8  04/21/2013 4.6   Magnesium (mg/dL)  Date Value  11/29/2020 2.1   Calcium (mg/dL)  Date Value  11/29/2020 8.1 (L)   Calcium, Total (mg/dL)  Date Value  04/21/2013 9.4   Albumin (g/dL)  Date Value  11/29/2020 2.7 (L)  03/30/2013 3.8   Phosphorus (mg/dL)  Date Value  11/29/2020 6.4 (H)   Sodium (mmol/L)  Date Value  11/29/2020 144  04/21/2013 133 (L)   Corrected Ca: 8.86  Assessment: Patient is a 87 y/o F with medical history including hearing loss, HTN, diabetes, MDD, history of CVA who presented to the ED 3/20 with falls / weakness and history of melenotic stools. Patient admitted with hemorrhagic shock secondary to suspected GIB and acute renal failure, Scr 3.40>3.48>3.94. Pharmacy has been consulted to assist with electrolyte monitoring and replacement as indicated.  Goal of Therapy:  Electrolytes within normal limits  Plan:  --No electrolyte replacement warranted at this time --Will follow-up electrolytes with AM labs   Benn Moulder, PharmD Pharmacy Resident  11/29/2020 7:03 AM

## 2020-11-29 NOTE — Consult Note (Signed)
Cardiology Consultation Note    Patient ID: Katie Graves, MRN: [id number], DOB/AGE: 85-Jun-1923 85 y.o. Admit date: 11/27/2020   Date of Consult: 11/29/2020 Primary Physician: Tracie Harrier, MD Primary Cardiologist: none  Chief Complaint: Weakness Reason for Consultation: Elevated troponin and GI bleed Requesting MD: Dr. Reesa Chew  HPI: Katie Graves is a 87 y.o. female with history of 87 year old female with history of hearing loss, diabetes, hypertension who was brought in by her family after they noted her to be more malaised and fatigued.  She also had melanotic stools.  On presentation she was significantly hypotensive.  She had severe anemia with a hemoglobin of 4 with acute kidney injury, transaminitis, altered mental status and significant elevated troponin.  Required Levophed initially.  Patient has a history of aortic stenosis.  Serum troponin was greater than 27,000.  Currently has a serum creatinine of 3.48 up from 0.97 which is her baseline.  Patient denies chest pain at present.  Chest x-ray revealed no acute cardiopulmonary disease.  EKG revealed sinus rhythm with ST depression in the inferolateral leads.  Currently being treated with hydralazine 25 mg 3 times daily,  Past Medical History:  Diagnosis Date  . Aortic stenosis   . Atrophic vaginitis   . Back pain   . Cancer ([place])   . CVA (cerebral infarction)   . Diabetes mellitus without complication ([place])   . Gastritis   . Hypertension   . Seasonal allergies   . Stroke [facility])    TIA x3  . Vertigo       Surgical History:  Past Surgical History:  Procedure Laterality Date  . ABDOMINAL HYSTERECTOMY    . BACK SURGERY    . BREAST BIOPSY Left    stereotactic biopsy  . COLON SURGERY    . TONSILLECTOMY       Home Meds: Prior to Admission medications   Medication Sig Start Date End Date Taking? Authorizing Provider  clopidogrel (PLAVIX) 75 MG tablet Take 75 mg by mouth every morning. 05/26/15  Yes [provider]  furosemide (LASIX) 20 MG tablet Take 20 mg by mouth daily. 10/31/20  Yes [provider]  glimepiride (AMARYL) 2 MG tablet Take 2 mg by mouth daily with breakfast.   Yes [provider]  hydrALAZINE (APRESOLINE) 25 MG tablet Take 25 mg by mouth 3 (three) times daily. 06/07/15  Yes [provider]  losartan (COZAAR) 100 MG tablet Take 100 mg by mouth daily. 06/03/15  Yes [provider]  Multiple Vitamins-Minerals (MULTIVITAMIN WITH MINERALS) tablet Take 1 tablet by mouth daily.   Yes [provider]  verapamil (CALAN-SR) 120 MG CR tablet Take 180 mg by mouth at bedtime.   Yes [provider]    Inpatient Medications:  . Chlorhexidine Gluconate Cloth  6 each Topical Daily  . hydrALAZINE  25 mg Oral TID  . insulin aspart  0-9 Units Subcutaneous Q4H  . lidocaine  1 patch Transdermal Q24H  . mouth rinse  15 mL Mouth Rinse BID  . pantoprazole (PROTONIX) IV  40 mg Intravenous Q12H   . sodium chloride 20 mL/hr at 11/29/20 1200    Allergies:  Allergies  Allergen Reactions  . Lidocaine Nausea Only    Other reaction(s): Unknown  . Novocain [Procaine] Nausea Only  . Penicillins     Has patient had a PCN reaction causing immediate rash, facial/tongue/throat swelling, SOB or lightheadedness with hypotension: no Has patient had a PCN reaction causing severe rash involving  mucus membranes or skin necrosis: yes Has patient had a PCN reaction that required hospitalization no Has patient had a PCN reaction occurring within the last 10 years: no If all of the above answers are "NO", then may proceed with Cephalosporin use.   . Sulfa Antibiotics Nausea Only  . Vancomycin Nausea Only    Other reaction(s): Unknown  . Codeine Rash    Social History   Socioeconomic History  . Marital status: Widowed    Spouse name: Not on file  . Number of children: Not on file  . Years of education: Not on file  . Highest education level: Not  on file  Occupational History  . Not on file  Tobacco Use  . Smoking status: Never Smoker  . Smokeless tobacco: Never Used  Substance and Sexual Activity  . Alcohol use: No    Alcohol/week: 0.0 standard drinks  . Drug use: No  . Sexual activity: Not on file  Other Topics Concern  . Not on file  Social History Narrative  . Not on file   Social Determinants of Health   Financial Resource Strain: Not on file  Food Insecurity: Not on file  Transportation Needs: Not on file  Physical Activity: Not on file  Stress: Not on file  Social Connections: Not on file  Intimate Partner Violence: Not on file     Family History  Problem Relation Age of Onset  . Hypertension Other   . Bladder Cancer Neg Hx   . Kidney disease Neg Hx      Review of Systems: A 12-system review of systems was performed and is negative except as noted in the HPI.  Labs: Recent Labs    11/28/20 0436  CKTOTAL 1,136*   Lab Results  Component Value Date   WBC 14.5 (H) 11/29/2020   HGB 11.9 (L) 11/29/2020   HCT 35.2 (L) 11/29/2020   MCV 83.4 11/29/2020   PLT 141 (L) 11/29/2020    Recent Labs  Lab 11/28/20 0436 11/29/20 0348  NA 138 144  K 4.0 3.8  CL 105 113*  CO2 17* 18*  BUN 107* 135*  CREATININE 3.48* 3.94*  CALCIUM 8.3* 8.1*  PROT 6.0*  --   BILITOT 0.9  --   ALKPHOS 85  --   ALT 555*  --   AST 909*  --   GLUCOSE 272* 147*   No results found for: CHOL, HDL, LDLCALC, TRIG No results found for: DDIMER  Radiology/Studies:  CT HEAD WO CONTRAST  Result Date: 11/28/2020 CLINICAL DATA:  Head trauma, loss of consciousness. Additional provided: Hemorrhagic shock. EXAM: CT HEAD WITHOUT CONTRAST TECHNIQUE: Contiguous axial images were obtained from the base of the skull through the vertex without intravenous contrast. COMPARISON:  Brain MRI 06/10/2019. CT angiogram head/neck 06/19/2015. Head CT 06/19/2013. FINDINGS: Brain: Moderate to moderately advanced cerebral atrophy. Comparatively mild  cerebellar atrophy. Known chronic small vessel infarcts within the bilateral cerebral white matter, right basal ganglia, left thalamus, right thalamocapsular junction and left pons. Additional small chronic infarcts within the bilateral cerebellar hemispheres Background ill-defined hypoattenuation within the cerebral white matter is nonspecific, but compatible with chronic small vessel ischemic disease. There is no acute intracranial hemorrhage. No demarcated cortical infarct. No extra-axial fluid collection. No evidence of intracranial mass. No midline shift. Vascular: No hyperdense vessel.  Atherosclerotic calcifications. Skull: Normal. Negative for fracture or focal lesion. Sinuses/Orbits: Visualized orbits show no acute finding. Trace bilateral ethmoid sinus mucosal thickening. Small right mastoid effusion. Other: Anterior subluxation  of the mandibular condyles bilaterally. IMPRESSION: No evidence of acute intracranial abnormality. Stable parenchymal atrophy and chronic small vessel ischemic disease. Redemonstrated chronic infarcts within the bilateral cerebral white matter, right basal ganglia, left thalamus, right thalamocapsular junction and left pons. Additional known chronic infarcts within the bilateral cerebellar hemispheres. Anterior subluxation of the mandibular condyles bilaterally, which may be related to patient positioning at the time of the examination. Correlate with physical exam to exclude TMJ dislocation. Small right mastoid effusion. Electronically Signed   By: Kellie Simmering DO   On: 11/28/2020 19:44   DG Chest Portable 1 View  Result Date: 11/27/2020 CLINICAL DATA:  Multiple falls and weakness for 2 days. EXAM: PORTABLE CHEST 1 VIEW COMPARISON:  Chest radiograph dated 11/05/2016. FINDINGS: The heart size is normal. Vascular calcifications are seen in the aortic arch. Both lungs are clear. Degenerative changes are seen in the spine. IMPRESSION: No active disease. Electronically Signed   By:  Zerita Boers M.D.   On: 11/27/2020 13:41   CT CHEST ABDOMEN PELVIS WO CONTRAST  Result Date: 11/27/2020 CLINICAL DATA:  Mid-back pain fall history with ongoing back pain EXAM: CT CHEST, ABDOMEN AND PELVIS WITHOUT CONTRAST TECHNIQUE: Multidetector CT imaging of the chest, abdomen and pelvis was performed following the standard protocol without IV contrast. COMPARISON:  CT abdomen pelvis 01/28/2017 FINDINGS: CT CHEST FINDINGS Cardiovascular: Enlarged left atrium. Likely enlarged left ventricle. Severe aortic valve leaflet calcifications. Severe mitral annular calcifications. No significant pericardial effusion. The thoracic aorta is normal in caliber. Severe atherosclerotic plaque of the thoracic aorta. Mild to moderate three-vessel coronary artery calcifications. Mediastinum/Nodes: No enlarged mediastinal or axillary lymph nodes. No gross hilar adenopathy, noting limited sensitivity for the detection of hilar adenopathy on this noncontrast study. There is a 1.1 cm hypodense lesion within the right thyroid gland. Trachea demonstrate no significant findings. Question esophageal wall thickening with limited evaluation on this noncontrast study. Air-fluid level within the esophageal lumen. Lungs/Pleura: No focal consolidation. No pulmonary nodule. No pulmonary mass. Subsegmental atelectasis appear bilateral trace pleural effusions. No pneumothorax. Musculoskeletal: No chest wall abnormality. No suspicious lytic or blastic osseous lesions. No acute displaced rib or sternal fracture. See below regarding spinal fractures. CT ABDOMEN PELVIS FINDINGS Hepatobiliary: No focal liver abnormality. No gallstones, gallbladder wall thickening, or pericholecystic fluid. No biliary dilatation. Pancreas: Diffusely atrophic. No focal lesion. Otherwise normal pancreatic contour. No surrounding inflammatory changes. No main pancreatic ductal dilatation. Spleen: Normal in size without focal abnormality. Adrenals/Urinary Tract:  Hyperplasia of bilateral adrenal glands with question adenoma on within the left right. Bilateral renal cortical scarring. Subcentimeter hyperdense right renal lesion (2:60). No nephrolithiasis, no hydronephrosis, and no contour-deforming renal mass. No ureterolithiasis or hydroureter. The urinary bladder is unremarkable. Stomach/Bowel: Ascending colon surgical changes. Stomach is within normal limits. No evidence of bowel wall thickening or dilatation. Diffuse descending colon and sigmoid diverticulosis. Appendix likely surgically removed. Vascular/Lymphatic: No abdominal aorta or iliac aneurysm. Severe atherosclerotic plaque of the aorta and its branches. Prominent but nonenlarged retroperitoneal lymph nodes. No abdominal, pelvic, or inguinal lymphadenopathy. Reproductive: Status post hysterectomy. No adnexal masses. Other: No intraperitoneal free fluid. No intraperitoneal free gas. No organized fluid collection. Musculoskeletal: Diffuse mild subcutaneus soft tissue edema no abdominal wall hernia or abnormality. No suspicious lytic or blastic osseous lesions. No acute displaced fracture. Multilevel degenerative changes of the spine. Grade 1 anterolisthesis of L4 on L5. Pubic symphysis degenerative changes. Age-indeterminate, possibly chronic, T9 compression fracture with greater than 35% height loss. Interval development of a a  T12 and L1 compression fracture. Greater than a year 60% height loss at the T12 level and greater than 5% height loss at the L1 level. No retropulsion into the central canal. IMPRESSION: 1. Likely acute T12 and L1 compression fractures. Age-indeterminate T9 compression fracture. Correlate with midline point tenderness to evaluate for acuity. 2. Otherwise no acute intrathoracic, intra-abdominal, intrapelvic abnormality with limited evaluation on this noncontrast study. 3. Question esophageal wall thickening with limited evaluation on this noncontrast study. 4. Other imaging findings of  potential clinical significance: Trace bilateral pleural effusions. Colonic diverticulosis with no acute diverticulitis. Indetemrinate subcentimeter hyperdense right renal lesion. Severe aortic valve leaflet and mitral annular calcifications. Aortic Atherosclerosis (ICD10-I70.0) including coronary artery calcifications. Electronically Signed   By: Iven Finn M.D.   On: 11/27/2020 23:21    Wt Readings from Last 3 Encounters:  11/28/20 56.7 kg  08/09/17 67.1 kg  02/01/17 68.5 kg    EKG: Sinus rhythm with diffuse ST-T wave inversion.  Physical Exam: Chronically ill-appearing elderly female Blood pressure 119/63, pulse 78, temperature (!) 97.5 F (36.4 C), temperature source Oral, resp. rate 18, height 5\' 7"  (1.702 m), weight 56.7 kg, SpO2 98 %. Body mass index is 19.58 kg/m. General: Well developed, well nourished, in no acute distress. Head: Normocephalic, atraumatic, sclera non-icteric, no xanthomas, nares are without discharge.  Neck: Negative for carotid bruits. JVD not elevated. Lungs: Clear bilaterally to auscultation without wheezes, rales, or rhonchi. Breathing is unlabored. Heart: RRR with S1 S2. No murmurs, rubs, or gallops appreciated. Abdomen: Soft, non-tender, non-distended with normoactive bowel sounds. No hepatomegaly. No rebound/guarding. No obvious abdominal masses. Msk:  Strength and tone appear normal for age. Extremities: No clubbing or cyanosis. No edema.  Distal pedal pulses are 2+ and equal bilaterally. Neuro: Alert and oriented X 3. No facial asymmetry. No focal deficit. Moves all extremities spontaneously. Psych:  Responds to questions appropriately with a normal affect.     Assessment and Plan  87 year old female admitted with multiple medical problems including probable non-ST ovation myocardial infarction, acute renal insufficiency, transaminitis, profound anemia with melena and severe anemia.  Currently has stabilized somewhat with aggressive treatment  initially.  She is alert and responds to questions appropriately.  She denies pain.  She is not a candidate for invasive cardiac evaluation given comorbidities including advanced age, renal insufficiency.  It appears that palliative care would be appropriate in this case.  I would support this.  Discussed briefly with family member in the room.  Signed, Teodoro Spray MD 11/29/2020, 9:59 PM Pager: [phone]

## 2020-11-30 DIAGNOSIS — Z515 Encounter for palliative care: Secondary | ICD-10-CM | POA: Diagnosis not present

## 2020-11-30 DIAGNOSIS — Z7189 Other specified counseling: Secondary | ICD-10-CM | POA: Diagnosis not present

## 2020-11-30 DIAGNOSIS — K922 Gastrointestinal hemorrhage, unspecified: Secondary | ICD-10-CM | POA: Diagnosis not present

## 2020-11-30 DIAGNOSIS — R578 Other shock: Secondary | ICD-10-CM | POA: Diagnosis not present

## 2020-11-30 LAB — CBC
HCT: 36.6 % (ref 36.0–46.0)
Hemoglobin: 12 g/dL (ref 12.0–15.0)
MCH: 28.4 pg (ref 26.0–34.0)
MCHC: 32.8 g/dL (ref 30.0–36.0)
MCV: 86.5 fL (ref 80.0–100.0)
Platelets: 122 10*3/uL — ABNORMAL LOW (ref 150–400)
RBC: 4.23 MIL/uL (ref 3.87–5.11)
RDW: 17.1 % — ABNORMAL HIGH (ref 11.5–15.5)
WBC: 11.8 10*3/uL — ABNORMAL HIGH (ref 4.0–10.5)
nRBC: 0.4 % — ABNORMAL HIGH (ref 0.0–0.2)

## 2020-11-30 LAB — GLUCOSE, CAPILLARY
Glucose-Capillary: 58 mg/dL — ABNORMAL LOW (ref 70–99)
Glucose-Capillary: 111 mg/dL — ABNORMAL HIGH (ref 70–99)
Glucose-Capillary: 87 mg/dL (ref 70–99)
Glucose-Capillary: 148 mg/dL — ABNORMAL HIGH (ref 70–99)
Glucose-Capillary: 119 mg/dL — ABNORMAL HIGH (ref 70–99)

## 2020-11-30 LAB — COMPREHENSIVE METABOLIC PANEL
ALT: 403 U/L — ABNORMAL HIGH (ref 0–44)
AST: 343 U/L — ABNORMAL HIGH (ref 15–41)
Albumin: 2.6 g/dL — ABNORMAL LOW (ref 3.5–5.0)
Alkaline Phosphatase: 70 U/L (ref 38–126)
Anion gap: 11 (ref 5–15)
BUN: 126 mg/dL — ABNORMAL HIGH (ref 8–23)
CO2: 19 mmol/L — ABNORMAL LOW (ref 22–32)
Calcium: 8.5 mg/dL — ABNORMAL LOW (ref 8.9–10.3)
Chloride: 111 mmol/L (ref 98–111)
Creatinine, Ser: 3.71 mg/dL — ABNORMAL HIGH (ref 0.44–1.00)
GFR, Estimated: 11 mL/min — ABNORMAL LOW (ref >60)
Glucose, Bld: 109 mg/dL — ABNORMAL HIGH (ref 70–99)
Potassium: 3.9 mmol/L (ref 3.5–5.1)
Sodium: 141 mmol/L (ref 135–145)
Total Bilirubin: 0.9 mg/dL (ref 0.3–1.2)
Total Protein: 4.9 g/dL — ABNORMAL LOW (ref 6.5–8.1)

## 2020-11-30 LAB — PHOSPHORUS: Phosphorus: 5.8 mg/dL — ABNORMAL HIGH (ref 2.5–4.6)

## 2020-11-30 LAB — TROPONIN I (HIGH SENSITIVITY): Troponin I (High Sensitivity): 16031 ng/L (ref <18)

## 2020-11-30 LAB — MAGNESIUM: Magnesium: 2.2 mg/dL (ref 1.7–2.4)

## 2020-11-30 LAB — CK: Total CK: 105 U/L (ref 38–234)

## 2020-11-30 MED ORDER — MORPHINE SULFATE (CONCENTRATE) 10 MG/0.5ML PO SOLN: 5.0000 mg | ORAL | Status: DC | PRN

## 2020-11-30 MED ORDER — LORAZEPAM 2 MG/ML IJ SOLN: 1.0000 mg | INTRAMUSCULAR | Status: DC | PRN

## 2020-11-30 MED ORDER — DEXTROSE 50 % IV SOLN
INTRAVENOUS | Status: AC
  Administered 2020-11-30: 25 mL
  Filled 2020-11-30: qty 50

## 2020-11-30 MED ORDER — POLYVINYL ALCOHOL 1.4 % OP SOLN
1.0000 [drp] | Freq: Four times a day (QID) | OPHTHALMIC | Status: DC | PRN
Start: 2020-11-30 — End: 2020-12-04
  Filled 2020-11-30: qty 15

## 2020-11-30 MED ORDER — HALOPERIDOL LACTATE 5 MG/ML IJ SOLN: 0.5000 mg | INTRAMUSCULAR | Status: DC | PRN

## 2020-11-30 MED ORDER — BIOTENE DRY MOUTH MT LIQD: 15.0000 mL | OROMUCOSAL | Status: DC | PRN

## 2020-11-30 MED ORDER — HALOPERIDOL LACTATE 2 MG/ML PO CONC
0.5000 mg | ORAL | Status: DC | PRN
  Filled 2020-11-30: qty 0.3

## 2020-11-30 MED ORDER — LORAZEPAM 1 MG PO TABS: 1.0000 mg | ORAL_TABLET | ORAL | Status: DC | PRN

## 2020-11-30 MED ORDER — ONDANSETRON 4 MG PO TBDP: 4.0000 mg | ORAL_TABLET | Freq: Four times a day (QID) | ORAL | Status: DC | PRN

## 2020-11-30 MED ORDER — ACETAMINOPHEN 325 MG PO TABS: 650.0000 mg | ORAL_TABLET | Freq: Four times a day (QID) | ORAL | Status: DC | PRN

## 2020-11-30 MED ORDER — MORPHINE SULFATE (CONCENTRATE) 10 MG/0.5ML PO SOLN
2.6000 mg | ORAL | Status: DC | PRN
  Filled 2020-11-30: qty 0.5

## 2020-11-30 MED ORDER — ACETAMINOPHEN 650 MG RE SUPP: 650.0000 mg | Freq: Four times a day (QID) | RECTAL | Status: DC | PRN

## 2020-11-30 MED ORDER — GLYCOPYRROLATE 1 MG PO TABS
1.0000 mg | ORAL_TABLET | ORAL | Status: DC | PRN
  Filled 2020-11-30: qty 1

## 2020-11-30 MED ORDER — HALOPERIDOL 0.5 MG PO TABS
0.5000 mg | ORAL_TABLET | ORAL | Status: DC | PRN
  Filled 2020-11-30: qty 1

## 2020-11-30 MED ORDER — ONDANSETRON HCL 4 MG/2ML IJ SOLN: 4.0000 mg | Freq: Four times a day (QID) | INTRAMUSCULAR | Status: DC | PRN

## 2020-11-30 MED ORDER — LORAZEPAM 2 MG/ML PO CONC
1.0000 mg | ORAL | Status: DC | PRN
  Administered 2020-12-01 – 2020-12-02 (×3): 1 mg via SUBLINGUAL
  Filled 2020-11-30 (×3): qty 1

## 2020-11-30 MED ORDER — GLYCOPYRROLATE 0.2 MG/ML IJ SOLN
0.2000 mg | INTRAMUSCULAR | Status: DC | PRN
  Filled 2020-11-30: qty 1

## 2020-11-30 MED ORDER — SODIUM CHLORIDE 0.9 % IV BOLUS
250.0000 mL | Freq: Once | INTRAVENOUS | Status: AC
  Administered 2020-11-30: 250 mL via INTRAVENOUS

## 2020-11-30 MED ORDER — MORPHINE SULFATE (CONCENTRATE) 10 MG/0.5ML PO SOLN
2.5000 mg | ORAL | Status: DC | PRN
  Administered 2020-11-30 – 2020-12-04 (×11): 5 mg via SUBLINGUAL
  Filled 2020-11-30 (×10): qty 0.5

## 2020-11-30 NOTE — Consult Note (Signed)
PHARMACY CONSULT NOTE  Pharmacy Consult for Electrolyte Monitoring and Replacement   Recent Labs: Potassium (mmol/L)  Date Value  11/30/2020 3.9  04/21/2013 4.6   Magnesium (mg/dL)  Date Value  11/30/2020 2.2   Calcium (mg/dL)  Date Value  11/30/2020 8.5 (L)   Calcium, Total (mg/dL)  Date Value  04/21/2013 9.4   Albumin (g/dL)  Date Value  11/30/2020 2.6 (L)  03/30/2013 3.8   Phosphorus (mg/dL)  Date Value  11/30/2020 5.8 (H)   Sodium (mmol/L)  Date Value  11/30/2020 141  04/21/2013 133 (L)   Corrected Ca: 9.6 mg/dL  Assessment: Patient is a 87 y/o F with medical history including hearing loss, HTN, diabetes, MDD, history of CVA who presented to the ED 3/20 with falls / weakness and history of melenotic stools. Patient admitted with hemorrhagic shock secondary to suspected GIB and acute renal failure. Pharmacy has been consulted to assist with electrolyte monitoring and replacement as indicated.  Goal of Therapy:  Electrolytes within normal limits  Plan:   No electrolyte replacement warranted at this time  Will follow-up electrolytes with AM labs   Dallie Piles, PharmD 11/30/2020 7:17 AM

## 2020-11-30 NOTE — Progress Notes (Signed)
BP 82/58. Dr Reesa Chew notified. Order received to hold a.m. hydralazine and to give a 223ml NS bolus

## 2020-11-30 NOTE — Progress Notes (Signed)
[facility]) Hospital Liaison RN note:  Received request from Quinn Axe, NP for family interest in [place]. Chart in review and eligibility is pending. Unfortunately, [facility] is not able to offer a room today. [place] Liaison will speak with family tomorrow and monitor for room availability. Hospital care team is aware.  Please call with any hospice related questions or concerns.  Thank you for the opportunity to participate in this patient's care.  Zandra Abts, RN [facility] Liaison [phone]

## 2020-11-30 NOTE — Progress Notes (Signed)
Palliative:   Katie Graves is lying quietly in bed.  She is sleeping with her eyes partially open.  She wakes easily when I call her name.  She is oriented to self, but is difficult to understand due to her dry mouth.  I am not sure that she can make her basic needs known.  There is no family at bedside at this time.  I offer Katie Graves some water.  She is able to drink from a straw, but seems to be aspirating.  Call to daughter, Katie Graves.  Katie Graves shares that she and her brother Katie Graves are entering the hospital now.  I meet with Katie Graves and Katie Graves in Katie Graves's room.  Katie Graves is able to take a few sips of water from [place].  Katie Graves and I talk just outside the room.  We talk in detail about NSTEMI including extremely elevated troponin, kidney function/labs, spinal fractures.  We talked about what we can and cannot change.  We talked about the benefits of residential hospice, what is and is not provided.  Katie Graves had stated yesterday that she was ready for comfort care.  Both Katie Graves and Katie Graves endorse comfort care, residential hospice.  [facility] is location of choice.  Katie Graves and Katie Graves are agreeable to unburden Mrs. Denning from painful treatments, instead, focusing on comfort.  Orders adjusted.    Conference with attending, bedside nursing staff, transition of care team, speech therapy, hospice representative related to patient condition, needs, goals of care, comfort care with residential hospice placement request.  Plan:  Comfort and dignity at end-of-life, requesting residential hospice with [facility]. Prognosis:   2 weeks or less would be expected based on NSTEMI with troponins peaked at 27,000 with no intervention, poor by mouth intake with worsening creatinine, poor mobility limited by spinal fractures, family's desire to focus on comfort and dignity, let nature take its course.  65 minutes, extended time   Quinn Axe, NP Palliative medicine team Team phone [phone] Greater than 50% of this time was spent counseling and coordinating care related to the above assessment and plan.

## 2020-11-30 NOTE — TOC Progression Note (Signed)
Transition of Care [facility]) - Progression Note    Patient Details  Name: Katie Graves MRN: [id number] Date of Birth: [birth year]  Transition of [facility]) CM/SW Contact  Beverly Sessions, RN Phone Number: 11/30/2020, 3:14 PM  Clinical Narrative:      [place] referral made by [facility] from Palliative.  Please consult TOC should any needs arise        Expected Discharge Plan and Services                                                 Social Determinants of Health (SDOH) Interventions    Readmission Risk Interventions No flowsheet data found.

## 2020-11-30 NOTE — Progress Notes (Signed)
PROGRESS NOTE  ICU transfer  Katie Graves  DUK:[id number] DOB: 1922-02-09 DOA: 11/27/2020 PCP: Tracie Harrier, MD   Brief Narrative: Taken from prior notes. 87 year old female with previous medical history of bilateral sensorineural hearing loss, essential hypertension, diabetes, major depressive disorder, history of CVA with basilar artery syndrome came in from home after family noted malaise fatigue with inability to walk post melanotic stools several times over the last few days starting Friday.  Patient came in profoundly hypotensive found to have severe anemia hemoglobin 4 with most recent at closer to 11 with circulatory shock.  Additional findings include AKI, transaminitis, altered mental status with confusion. Admitted with hemorrhagic shock, initially required pressors and received 5 unit of PRBCs. Hemoglobin improved to 13.4 and now again trending down, continue to have worsening renal function.  Off the pressors now with elevated blood pressure, palliative care was also consulted and today family decided about pursuing hospice home with a focus on comfort and dignity.  Subjective: Patient was little more responsive when seen today.  Stating that I am little better.  Having back pain.  Assessment & Plan:   Active Problems:   Acute blood loss anemia (ABLA)   Hemorrhagic shock (HCC)  Acute blood loss anemia.  Patient did not had any more BM per nursing staff.  Hemoglobin improved to 13.4 and decreased to 11.9 and remained stable with today's reading of 12.  No other obvious bleeding. Patient received total of 5 units of PRBC.  Baseline hemoglobin around 11. GI was also consulted-no plan for any EGD as risk outweighs the benefit at this time. -Continue holding home dose of aspirin and Plavix-patient should be taken off as very little benefit in this age group. -Monitor hemoglobin -Transfuse if below 8 -Continue with twice daily IV Protonix -In palliative care meeting with  family today they decided to proceed with hospice home.  NSTEMI.  Patient denies any chest pain with troponin more than 27,000, started trending down.  EKG with ST depression in inferolateral leads with the setting of hemorrhagic shock.  This makes her in the category of NSTEMI.  Cardiology was consulted and she is not a candidate for any intervention and they are recommending focus on comfort only.  Rhabdomyolysis.  CK elevated at 1100 and now back to baseline -Monitor CK  Dysphagia.  Apparently they chronic issue with recurrent regurgitation. GI does not want any procedure. -Conservative management.  AKI with CKD stage IIIb.  Most likely secondary to hemorrhagic shock. Renal function with some mild worsening.  Baseline creatinine around 1.4-1.6.  Today it was 3.71 with BUN of 126. -We will not do more labs as focus has been shifted to comfort care only.  Leukocytosis.  Most likely reactive secondary to hemorrhagic shock.  Improving.  No obvious source of infection and blood cultures remain negative. -Continue to monitor  Transaminitis.  Most likely secondary to shock liver with hemorrhagic shock. Now improving.  Hypertension.  Blood pressure within goal, just with restarting hydralazine.  On multiple antihypertensives at home which includes losartan, verapamil, Lasix and hydralazine. -Continue home dose of hydralazine. -Keep holding Lasix and losartan due to AKI. -Will add verapamil if needed.  Type 2 diabetes mellitus.  Patient was on Amaryl at home. -SSI for now.  Protein caloric malnutrition.  Patient appears malnourished with mildly decreased total protein and albumin. Estimated body mass index is 20.37 kg/m as calculated from the following:   Height as of this encounter: 5\' 7"  (1.702 m).   Weight  as of this encounter: 20 kg.  -Dietitian consult  Patient is very high risk for deterioration and death secondary to advanced age, multiple comorbidities, NSTEMI and worsening  renal function at this time.  Not a suitable candidate for any aggressive measures.  Family decided to proceed with comfort measures only.  Referral was provided by palliative care for hospice home.  No bed availability today.  Objective: Vitals:   11/29/20 2312 11/30/20 0358 11/30/20 0619 11/30/20 0728  BP: 120/66 (!) 112/52  118/66  Pulse: 81 82  79  Resp: 16   20  Temp: 97.7 F (36.5 C) 98.4 F (36.9 C)  98.2 F (36.8 C)  TempSrc: Axillary Axillary    SpO2: 97% 95%  95%  Weight:   59 kg   Height:        Intake/Output Summary (Last 24 hours) at 11/30/2020 0803 Last data filed at 11/30/2020 0645 Gross per 24 hour  Intake 319.93 ml  Output 575 ml  Net -255.07 ml   Filed Weights   11/27/20 1228 11/28/20 0500 11/30/20 0619  Weight: 60 kg 56.7 kg 59 kg    Examination:  General.  Frail elderly lady, in no acute distress. Pulmonary.  Lungs clear bilaterally, normal respiratory effort. CV.  Regular rate and rhythm, no JVD, rub or murmur. Abdomen.  Soft, nontender, nondistended, BS positive. CNS.  Alert and oriented x1.  No focal neurologic deficit. Extremities.  No edema, no cyanosis, pulses intact and symmetrical. Psychiatry.  Judgment and insight appears impaired.  DVT prophylaxis: SCDs Code Status: DNR Family Communication: Multiple family members with palliative care meeting. Disposition Plan:  Status is: Inpatient  Remains inpatient appropriate because:Inpatient level of care appropriate due to severity of illness   Dispo: The patient is from: Home              Anticipated d/c is to: Home              Patient currently is not medically stable to d/c.   Difficult to place patient No              Level of care: Med-Surg  All the records are reviewed and case discussed with Care Management/Social Worker. Management plans discussed with the patient, nursing and they are in agreement.  Consultants:   PCCM  GI  Procedures:  Antimicrobials:   Data Reviewed:  I have personally reviewed following labs and imaging studies  CBC: Recent Labs  Lab 11/27/20 1236 11/27/20 1907 11/27/20 2356 11/28/20 0436 11/29/20 0348 11/30/20 0515  WBC 19.3*  --   --  17.8* 14.5* 11.8*  NEUTROABS 16.9*  --   --   --   --   --   HGB 4.8* 13.1 12.5 13.4 11.9* 12.0  HCT 16.0* 38.3 37.4 40.5 35.2* 36.6  MCV 79.6*  --   --  84.0 83.4 86.5  PLT 277  --   --  209 141* 161*   Basic Metabolic Panel: Recent Labs  Lab 11/27/20 1236 11/28/20 0436 11/29/20 0348 11/30/20 0515  NA 139 138 144 141  K 4.3 4.0 3.8 3.9  CL 103 105 113* 111  CO2 11* 17* 18* 19*  GLUCOSE 155* 272* 147* 109*  BUN 108* 107* 135* 126*  CREATININE 3.40* 3.48* 3.94* 3.71*  CALCIUM 9.1 8.3* 8.1* 8.5*  MG  --  2.2 2.1 2.2  PHOS  --  7.2* 6.4* 5.8*   GFR: Estimated Creatinine Clearance: 8.1 mL/min (A) (by C-G formula based on  SCr of 3.71 mg/dL (H)). Liver Function Tests: Recent Labs  Lab 11/27/20 1236 11/28/20 0436 11/29/20 0348 11/30/20 0515  AST 499* 909*  --  343*  ALT 195* 555*  --  403*  ALKPHOS 70 85  --  70  BILITOT 0.7 0.9  --  0.9  PROT 5.6* 6.0*  --  4.9*  ALBUMIN 3.1* 3.3* 2.7* 2.6*   No results for input(s): LIPASE, AMYLASE in the last 168 hours. No results for input(s): AMMONIA in the last 168 hours. Coagulation Profile: No results for input(s): INR, PROTIME in the last 168 hours. Cardiac Enzymes: Recent Labs  Lab 11/28/20 0436 11/30/20 0515  CKTOTAL 1,136* 105   BNP (last 3 results) No results for input(s): PROBNP in the last 8760 hours. HbA1C: No results for input(s): HGBA1C in the last 72 hours. CBG: Recent Labs  Lab 11/29/20 2025 11/29/20 2307 11/30/20 0405 11/30/20 0451 11/30/20 0747  GLUCAP 183* 145* 58* 111* 87   Lipid Profile: No results for input(s): CHOL, HDL, LDLCALC, TRIG, CHOLHDL, LDLDIRECT in the last 72 hours. Thyroid Function Tests: No results for input(s): TSH, T4TOTAL, FREET4, T3FREE, THYROIDAB in the last 72 hours. Anemia  Panel: No results for input(s): VITAMINB12, FOLATE, FERRITIN, TIBC, IRON, RETICCTPCT in the last 72 hours. Sepsis Labs: Recent Labs  Lab 11/27/20 1236 11/27/20 1701 11/28/20 0436  LATICACIDVEN >11.0* 6.0* 2.4*    Recent Results (from the past 240 hour(s))  Culture, blood (routine x 2)     Status: None (Preliminary result)   Collection Time: 11/27/20 12:36 PM   Specimen: BLOOD  Result Value Ref Range Status   Specimen Description BLOOD RIGHT ANTECUBITAL  Final   Special Requests   Final    BOTTLES DRAWN AEROBIC AND ANAEROBIC Blood Culture results may not be optimal due to an inadequate volume of blood received in culture bottles   Culture   Final    NO GROWTH 3 DAYS Performed at [facility], [address], [place], [place] [address]    Report Status PENDING  Incomplete  Culture, blood (routine x 2)     Status: None (Preliminary result)   Collection Time: 11/27/20 12:39 PM   Specimen: BLOOD  Result Value Ref Range Status   Specimen Description BLOOD LEFT ANTECUBITAL  Final   Special Requests   Final    BOTTLES DRAWN AEROBIC AND ANAEROBIC Blood Culture adequate volume   Culture   Final    NO GROWTH 3 DAYS Performed at [facility], [address], [place], [place] [address]    Report Status PENDING  Incomplete  Resp Panel by RT-PCR (Flu A&B, Covid) Nasopharyngeal Swab     Status: None   Collection Time: 11/27/20 12:45 PM   Specimen: Nasopharyngeal Swab; Nasopharyngeal(NP) swabs in vial transport medium  Result Value Ref Range Status   SARS Coronavirus 2 by RT PCR NEGATIVE NEGATIVE Final    Comment: (NOTE) SARS-CoV-2 target nucleic acids are NOT DETECTED.  The SARS-CoV-2 RNA is generally detectable in upper respiratory specimens during the acute phase of infection. The lowest concentration of SARS-CoV-2 viral copies this assay can detect is 138 copies/mL. A negative result does not preclude SARS-Cov-2 infection and should not be used as the sole basis  for treatment or other patient management decisions. A negative result may occur with  improper specimen collection/handling, submission of specimen other than nasopharyngeal swab, presence of viral mutation(s) within the areas targeted by this assay, and inadequate number of viral copies(<138 copies/mL). A negative  result must be combined with clinical observations, patient history, and epidemiological information. The expected result is Negative.  Fact Sheet for Patients:  EntrepreneurPulse.com.au  Fact Sheet for Healthcare Providers:  IncredibleEmployment.be  This test is no t yet approved or cleared by the [place] FDA and  has been authorized for detection and/or diagnosis of SARS-CoV-2 by FDA under an Emergency Use Authorization (EUA). This EUA will remain  in effect (meaning this test can be used) for the duration of the COVID-19 declaration under Section 564(b)(1) of the Act, 21 U.S.C.section 360bbb-3(b)(1), unless the authorization is terminated  or revoked sooner.       Influenza A by PCR NEGATIVE NEGATIVE Final   Influenza B by PCR NEGATIVE NEGATIVE Final    Comment: (NOTE) The Xpert Xpress SARS-CoV-2/FLU/RSV plus assay is intended as an aid in the diagnosis of influenza from Nasopharyngeal swab specimens and should not be used as a sole basis for treatment. Nasal washings and aspirates are unacceptable for Xpert Xpress SARS-CoV-2/FLU/RSV testing.  Fact Sheet for Patients: EntrepreneurPulse.com.au  Fact Sheet for Healthcare Providers: IncredibleEmployment.be  This test is not yet approved or cleared by the [place] FDA and has been authorized for detection and/or diagnosis of SARS-CoV-2 by FDA under an Emergency Use Authorization (EUA). This EUA will remain in effect (meaning this test can be used) for the duration of the COVID-19 declaration under Section 564(b)(1) of the Act, 21  U.S.C. section 360bbb-3(b)(1), unless the authorization is terminated or revoked.  Performed at [facility], [address] [place], [place] [address]   MRSA PCR Screening     Status: None   Collection Time: 11/27/20  5:48 PM   Specimen: Nasopharyngeal  Result Value Ref Range Status   MRSA by PCR NEGATIVE NEGATIVE Final    Comment:        The GeneXpert MRSA Assay (FDA approved for NASAL specimens only), is one component of a comprehensive MRSA colonization surveillance program. It is not intended to diagnose MRSA infection nor to guide or monitor treatment for MRSA infections. Performed at [facility], [address] [place], [place] [address]      Radiology Studies: CT HEAD WO CONTRAST  Result Date: 11/28/2020 CLINICAL DATA:  Head trauma, loss of consciousness. Additional provided: Hemorrhagic shock. EXAM: CT HEAD WITHOUT CONTRAST TECHNIQUE: Contiguous axial images were obtained from the base of the skull through the vertex without intravenous contrast. COMPARISON:  Brain MRI 06/10/2019. CT angiogram head/neck 06/19/2015. Head CT 06/19/2013. FINDINGS: Brain: Moderate to moderately advanced cerebral atrophy. Comparatively mild cerebellar atrophy. Known chronic small vessel infarcts within the bilateral cerebral white matter, right basal ganglia, left thalamus, right thalamocapsular junction and left pons. Additional small chronic infarcts within the bilateral cerebellar hemispheres Background ill-defined hypoattenuation within the cerebral white matter is nonspecific, but compatible with chronic small vessel ischemic disease. There is no acute intracranial hemorrhage. No demarcated cortical infarct. No extra-axial fluid collection. No evidence of intracranial mass. No midline shift. Vascular: No hyperdense vessel.  Atherosclerotic calcifications. Skull: Normal. Negative for fracture or focal lesion. Sinuses/Orbits: Visualized orbits show no acute finding. Trace  bilateral ethmoid sinus mucosal thickening. Small right mastoid effusion. Other: Anterior subluxation of the mandibular condyles bilaterally. IMPRESSION: No evidence of acute intracranial abnormality. Stable parenchymal atrophy and chronic small vessel ischemic disease. Redemonstrated chronic infarcts within the bilateral cerebral white matter, right basal ganglia, left thalamus, right thalamocapsular junction and left pons. Additional known chronic infarcts within the bilateral cerebellar hemispheres. Anterior subluxation of the  mandibular condyles bilaterally, which may be related to patient positioning at the time of the examination. Correlate with physical exam to exclude TMJ dislocation. Small right mastoid effusion. Electronically Signed   By: Kellie Simmering DO   On: 11/28/2020 19:44    Scheduled Meds: . Chlorhexidine Gluconate Cloth  6 each Topical Daily  . hydrALAZINE  25 mg Oral TID  . insulin aspart  0-9 Units Subcutaneous Q4H  . lidocaine  1 patch Transdermal Q24H  . mouth rinse  15 mL Mouth Rinse BID  . pantoprazole (PROTONIX) IV  40 mg Intravenous Q12H   Continuous Infusions: . sodium chloride 20 mL/hr at 11/29/20 1200     LOS: 3 days   Time spent: 30 minutes. More than 50% of the time was spent in counseling/coordination of care  Lorella Nimrod, MD [facility]  If 7PM-7AM, please contact night-coverage [email or web address]  11/30/2020, 8:03 AM   This record has been created using Systems analyst. Errors have been sought and corrected,but may not always be located. Such creation errors do not reflect on the standard of care.

## 2020-11-30 NOTE — Progress Notes (Addendum)
SLP Cancellation Note  Patient Details Name: Katie Graves MRN: [id number] DOB: [birth year]   Cancelled treatment:       Reason Eval/Treat Not Completed: Patient not medically ready (chart reviewed; consulted NSG and Palliative Care). Per consult w/ Team, Palliative Care recommended Holding on performing BSE w/ pt at this time d/t medical decline. A family meeting scheduled at 2 PM today to discuss that pt may be nearing end-of-life. NSG present and agreed.  ST services will stand by and be available for BSE when appropriate. Recommend general aspiration precautions, frequent oral care, and any oral medications ordered by MD given Crushed in Puree as overt s/s of aspiration when drinking thin liquids have been noted by Team.   ADDENDUM: per chart notes (NSTEMI with troponins peaked at 27,000 with no intervention, poor by mouth intake with worsening creatinine) and Palliative Care visit note w/ Family this PM, pt's family "are agreeable to unburden [name] from painful treatments, instead, focusing on comfort. Orders adjusted for Hospice Home Referral.  ST services will sign off at this time w/ NSG to reconsult if further need for education.     Orinda Kenner, MS, CCC-SLP Speech Language Pathologist Rehab Services [phone] [facility] 11/30/2020, 11:58 AM

## 2020-11-30 NOTE — Progress Notes (Signed)
Hypoglycemic Event  CBG: 58  Treatment: D50 12.5g 25 mL  Symptoms: None  Follow-up CBG: Time 0450 CBG Result:111  Possible Reasons for Event: Poor oral intake   Comments/MD notified: Dr. Eunice Blase  Ren Aspinall

## 2020-11-30 NOTE — Care Management Important Message (Signed)
Important Message  Patient Details  Name: MONQUIE FULGHAM MRN: [id number] Date of Birth: 1922-04-19   Medicare Important Message Given:  Yes     Dannette Barbara 11/30/2020, 11:27 AM

## 2020-12-01 DIAGNOSIS — K922 Gastrointestinal hemorrhage, unspecified: Secondary | ICD-10-CM | POA: Diagnosis not present

## 2020-12-01 DIAGNOSIS — Z515 Encounter for palliative care: Secondary | ICD-10-CM | POA: Diagnosis not present

## 2020-12-01 DIAGNOSIS — Z7189 Other specified counseling: Secondary | ICD-10-CM | POA: Diagnosis not present

## 2020-12-01 NOTE — Progress Notes (Signed)
PROGRESS NOTE  ICU transfer  Katie Graves  TUU:[id number] DOB: [birth year] DOA: 11/27/2020 PCP: Tracie Harrier, MD   Brief Narrative: Taken from prior notes. 87 year old female with previous medical history of bilateral sensorineural hearing loss, essential hypertension, diabetes, major depressive disorder, history of CVA with basilar artery syndrome came in from home after family noted malaise fatigue with inability to walk post melanotic stools several times over the last few days starting Friday.  Patient came in profoundly hypotensive found to have severe anemia hemoglobin 4 with most recent at closer to 11 with circulatory shock.  Additional findings include AKI, transaminitis, altered mental status with confusion. Admitted with hemorrhagic shock, initially required pressors and received 5 unit of PRBCs. Hemoglobin improved to 13.4 and now again trending down, continue to have worsening renal function.  Off the pressors now with elevated blood pressure, palliative care was also consulted and today family decided about pursuing hospice home with a focus on comfort and dignity.  Subjective: Patient was seen and examined today.  No new complaints.  She was able to eat some for breakfast.  Daughter at bedside.  Unable to take much due to food sticking in her esophagus and some regurgitation, advised taking small frequent meals which should be soft, per daughter she was doing similar with her for some time and she can tolerate that way. Patient was asking if she can eat scrambled egg.  Diet order was changed to soft diet.  Assessment & Plan:   Active Problems:   Acute blood loss anemia (ABLA)   Hemorrhagic shock (HCC)  Acute blood loss anemia.  Patient did not had any more BM per nursing staff.  Hemoglobin improved to 13.4 and decreased to 11.9 and remained stable with today's reading of 12.  No other obvious bleeding. Patient received total of 5 units of PRBC.  Baseline hemoglobin around  11. GI was also consulted-no plan for any EGD as risk outweighs the benefit at this time. -Continue holding home dose of aspirin and Plavix-patient should be taken off as very little benefit in this age group. -Monitor hemoglobin -Transfuse if below 8 -Continue with twice daily Protonix. -Patient is now comfort care-no escalation of care, comfort care orders are in-waiting for hospice home bed availability.  NSTEMI.  Patient denies any chest pain with troponin more than 27,000, started trending down.  EKG with ST depression in inferolateral leads with the setting of hemorrhagic shock.  This makes her in the category of NSTEMI.  Cardiology was consulted and she is not a candidate for any intervention and they are recommending focus on comfort only.  Rhabdomyolysis.  CK elevated at 1100 and now back to baseline -Monitor CK  Dysphagia.  Apparently they chronic issue with recurrent regurgitation. GI does not want any procedure. -Conservative management.  AKI with CKD stage IIIb.  Most likely secondary to hemorrhagic shock. Renal function with some mild worsening.  Baseline creatinine around 1.4-1.6.  Today it was 3.71 with BUN of 126. -We will not do more labs as focus has been shifted to comfort care only.  Leukocytosis.  Most likely reactive secondary to hemorrhagic shock.  Improving.  No obvious source of infection and blood cultures remain negative. -Continue to monitor  Transaminitis.  Most likely secondary to shock liver with hemorrhagic shock. Now improving.  Hypertension.  Blood pressure within goal, just with restarting hydralazine.  On multiple antihypertensives at home which includes losartan, verapamil, Lasix and hydralazine. -Continue home dose of hydralazine. -Keep holding Lasix and  losartan due to AKI. -Will add verapamil if needed.  Type 2 diabetes mellitus.  Patient was on Amaryl at home. -SSI for now.  Protein caloric malnutrition.  Patient appears malnourished with  mildly decreased total protein and albumin. Estimated body mass index is 20.37 kg/m as calculated from the following:   Height as of this encounter: 5\' 7"  (1.702 m).   Weight as of this encounter: 59 kg.  -Dietitian consult  Patient is very high risk for deterioration and death secondary to advanced age, multiple comorbidities, NSTEMI and worsening renal function at this time.  Not a suitable candidate for any aggressive measures.  Family decided to proceed with comfort measures only.  Referral was provided by palliative care for hospice home.  No bed availability today.  Objective: Vitals:   11/30/20 0728 11/30/20 1001 11/30/20 1006 11/30/20 1132  BP: 118/66 (!) 82/56 (!) 82/58 119/67  Pulse: 79 80  78  Resp: 20 16  20   Temp: 98.2 F (36.8 C) 98 F (36.7 C)  97.7 F (36.5 C)  TempSrc:  Oral  Oral  SpO2: 95% 96%  97%  Weight:      Height:        Intake/Output Summary (Last 24 hours) at 12/01/2020 1622 Last data filed at 12/01/2020 0525 Gross per 24 hour  Intake --  Output 750 ml  Net -750 ml   Filed Weights   11/27/20 1228 11/28/20 0500 11/30/20 0619  Weight: 60 kg 56.7 kg 59 kg    Examination:  General.  Pleasant elderly lady, in no acute distress. Pulmonary.  Lungs clear bilaterally, normal respiratory effort. CV.  Regular rate and rhythm, no JVD, rub or murmur. Abdomen.  Soft, nontender, nondistended, BS positive. CNS.  Alert and oriented x3.  No focal neurologic deficit. Extremities.  No edema, no cyanosis, pulses intact and symmetrical. Psychiatry.  Judgment and insight appears normal.  DVT prophylaxis: SCDs Code Status: DNR Family Communication: Daughter was updated at bedside Disposition Plan:  Status is: Inpatient  Remains inpatient appropriate because:Inpatient level of care appropriate due to severity of illness   Dispo: The patient is from: Home              Anticipated d/c is to: Hospice home              Patient currently is not medically stable  to d/c.   Difficult to place patient No              Level of care: Med-Surg  All the records are reviewed and case discussed with Care Management/Social Worker. Management plans discussed with the patient, nursing and they are in agreement.  Consultants:   PCCM  GI  Procedures:  Antimicrobials:   Data Reviewed: I have personally reviewed following labs and imaging studies  CBC: Recent Labs  Lab 11/27/20 1236 11/27/20 1907 11/27/20 2356 11/28/20 0436 11/29/20 0348 11/30/20 0515  WBC 19.3*  --   --  17.8* 14.5* 11.8*  NEUTROABS 16.9*  --   --   --   --   --   HGB 4.8* 13.1 12.5 13.4 11.9* 12.0  HCT 16.0* 38.3 37.4 40.5 35.2* 36.6  MCV 79.6*  --   --  84.0 83.4 86.5  PLT 277  --   --  209 141* 412*   Basic Metabolic Panel: Recent Labs  Lab 11/27/20 1236 11/28/20 0436 11/29/20 0348 11/30/20 0515  NA 139 138 144 141  K 4.3 4.0 3.8 3.9  CL 103  105 113* 111  CO2 11* 17* 18* 19*  GLUCOSE 155* 272* 147* 109*  BUN 108* 107* 135* 126*  CREATININE 3.40* 3.48* 3.94* 3.71*  CALCIUM 9.1 8.3* 8.1* 8.5*  MG  --  2.2 2.1 2.2  PHOS  --  7.2* 6.4* 5.8*   GFR: Estimated Creatinine Clearance: 8.1 mL/min (A) (by C-G formula based on SCr of 3.71 mg/dL (H)). Liver Function Tests: Recent Labs  Lab 11/27/20 1236 11/28/20 0436 11/29/20 0348 11/30/20 0515  AST 499* 909*  --  343*  ALT 195* 555*  --  403*  ALKPHOS 70 85  --  70  BILITOT 0.7 0.9  --  0.9  PROT 5.6* 6.0*  --  4.9*  ALBUMIN 3.1* 3.3* 2.7* 2.6*   No results for input(s): LIPASE, AMYLASE in the last 168 hours. No results for input(s): AMMONIA in the last 168 hours. Coagulation Profile: No results for input(s): INR, PROTIME in the last 168 hours. Cardiac Enzymes: Recent Labs  Lab 11/28/20 0436 11/30/20 0515  CKTOTAL 1,136* 105   BNP (last 3 results) No results for input(s): PROBNP in the last 8760 hours. HbA1C: No results for input(s): HGBA1C in the last 72 hours. CBG: Recent Labs  Lab 11/30/20 0405  11/30/20 0451 11/30/20 0747 11/30/20 1146 11/30/20 1729  GLUCAP 58* 111* 87 148* 119*   Lipid Profile: No results for input(s): CHOL, HDL, LDLCALC, TRIG, CHOLHDL, LDLDIRECT in the last 72 hours. Thyroid Function Tests: No results for input(s): TSH, T4TOTAL, FREET4, T3FREE, THYROIDAB in the last 72 hours. Anemia Panel: No results for input(s): VITAMINB12, FOLATE, FERRITIN, TIBC, IRON, RETICCTPCT in the last 72 hours. Sepsis Labs: Recent Labs  Lab 11/27/20 1236 11/27/20 1701 11/28/20 0436  LATICACIDVEN >11.0* 6.0* 2.4*    Recent Results (from the past 240 hour(s))  Culture, blood (routine x 2)     Status: None (Preliminary result)   Collection Time: 11/27/20 12:36 PM   Specimen: BLOOD  Result Value Ref Range Status   Specimen Description BLOOD RIGHT ANTECUBITAL  Final   Special Requests   Final    BOTTLES DRAWN AEROBIC AND ANAEROBIC Blood Culture results may not be optimal due to an inadequate volume of blood received in culture bottles   Culture   Final    NO GROWTH 4 DAYS Performed at [facility], [address], [place], [place] [address]    Report Status PENDING  Incomplete  Culture, blood (routine x 2)     Status: None (Preliminary result)   Collection Time: 11/27/20 12:39 PM   Specimen: BLOOD  Result Value Ref Range Status   Specimen Description BLOOD LEFT ANTECUBITAL  Final   Special Requests   Final    BOTTLES DRAWN AEROBIC AND ANAEROBIC Blood Culture adequate volume   Culture   Final    NO GROWTH 4 DAYS Performed at [facility], [address] [place], [place] [address]    Report Status PENDING  Incomplete  Resp Panel by RT-PCR (Flu A&B, Covid) Nasopharyngeal Swab     Status: None   Collection Time: 11/27/20 12:45 PM   Specimen: Nasopharyngeal Swab; Nasopharyngeal(NP) swabs in vial transport medium  Result Value Ref Range Status   SARS Coronavirus 2 by RT PCR NEGATIVE NEGATIVE Final    Comment: (NOTE) SARS-CoV-2 target nucleic acids  are NOT DETECTED.  The SARS-CoV-2 RNA is generally detectable in upper respiratory specimens during the acute phase of infection. The lowest concentration of SARS-CoV-2 viral copies this assay can detect is  138 copies/mL. A negative result does not preclude SARS-Cov-2 infection and should not be used as the sole basis for treatment or other patient management decisions. A negative result may occur with  improper specimen collection/handling, submission of specimen other than nasopharyngeal swab, presence of viral mutation(s) within the areas targeted by this assay, and inadequate number of viral copies(<138 copies/mL). A negative result must be combined with clinical observations, patient history, and epidemiological information. The expected result is Negative.  Fact Sheet for Patients:  EntrepreneurPulse.com.au  Fact Sheet for Healthcare Providers:  IncredibleEmployment.be  This test is no t yet approved or cleared by the [place] FDA and  has been authorized for detection and/or diagnosis of SARS-CoV-2 by FDA under an Emergency Use Authorization (EUA). This EUA will remain  in effect (meaning this test can be used) for the duration of the COVID-19 declaration under Section 564(b)(1) of the Act, 21 U.S.C.section 360bbb-3(b)(1), unless the authorization is terminated  or revoked sooner.       Influenza A by PCR NEGATIVE NEGATIVE Final   Influenza B by PCR NEGATIVE NEGATIVE Final    Comment: (NOTE) The Xpert Xpress SARS-CoV-2/FLU/RSV plus assay is intended as an aid in the diagnosis of influenza from Nasopharyngeal swab specimens and should not be used as a sole basis for treatment. Nasal washings and aspirates are unacceptable for Xpert Xpress SARS-CoV-2/FLU/RSV testing.  Fact Sheet for Patients: EntrepreneurPulse.com.au  Fact Sheet for Healthcare Providers: IncredibleEmployment.be  This test is  not yet approved or cleared by the [place] FDA and has been authorized for detection and/or diagnosis of SARS-CoV-2 by FDA under an Emergency Use Authorization (EUA). This EUA will remain in effect (meaning this test can be used) for the duration of the COVID-19 declaration under Section 564(b)(1) of the Act, 21 U.S.C. section 360bbb-3(b)(1), unless the authorization is terminated or revoked.  Performed at [facility], [address] [place], [place] [address]   MRSA PCR Screening     Status: None   Collection Time: 11/27/20  5:48 PM   Specimen: Nasopharyngeal  Result Value Ref Range Status   MRSA by PCR NEGATIVE NEGATIVE Final    Comment:        The GeneXpert MRSA Assay (FDA approved for NASAL specimens only), is one component of a comprehensive MRSA colonization surveillance program. It is not intended to diagnose MRSA infection nor to guide or monitor treatment for MRSA infections. Performed at [facility], [address] [place], [place] [address]      Radiology Studies: No results found.  Scheduled Meds: . lidocaine  1 patch Transdermal Q24H  . mouth rinse  15 mL Mouth Rinse BID  . pantoprazole (PROTONIX) IV  40 mg Intravenous Q12H   Continuous Infusions: . sodium chloride Stopped (11/29/20 1554)     LOS: 4 days   Time spent: 30 minutes. More than 50% of the time was spent in counseling/coordination of care  Lorella Nimrod, MD [facility]  If 7PM-7AM, please contact night-coverage [email or web address]  12/01/2020, 4:22 PM   This record has been created using Systems analyst. Errors have been sought and corrected,but may not always be located. Such creation errors do not reflect on the standard of care.

## 2020-12-01 NOTE — Progress Notes (Signed)
Palliative:   Mrs. Mackintosh is lying quietly in bed with nursing staff at bedside attending to personal needs. She is now full comfort care and overall appears comfortable.  Her daughter was at bedside earlier this morning.  Mrs. Holeman is awaiting a bed at residential hospice with [facility].   Conference with attending, bedside nursing staff and TOC related to patient condition, needs, Hospice care, and symptom management.  DNR completed.   Plan:   FULL COMFORT care.  Awaiting bed at [facility] in [place].     25 minutes  Quinn Axe, NP Palliative Medicine Team  Team phone [phone] Greater than 50% of this time was spent counseling and coordinating care related to the above assessment and plan.

## 2020-12-01 NOTE — Progress Notes (Signed)
[facility]) Hospital Liaison RN note:  Chart has been reviewed and eligibility was approved. Spoke with daughter, Nevin Bloodgood over the phone to confirm interests and explain services. She verbalized understanding and all questions were answered. Unfortunately, Hospice Home does not have a bed available today. Hospital care team is aware. Nevin Bloodgood will sign consents when a bed is available. [place] Liaison will follow for room availability.    Please call with any hospice related questions or concerns.  Thank you for the opportunity to participate in this patient's care.  Zandra Abts, RN [facility] Liaison  [phone]

## 2020-12-02 DIAGNOSIS — Z515 Encounter for palliative care: Secondary | ICD-10-CM

## 2020-12-02 DIAGNOSIS — Z66 Do not resuscitate: Secondary | ICD-10-CM

## 2020-12-02 DIAGNOSIS — Z7189 Other specified counseling: Secondary | ICD-10-CM

## 2020-12-02 DIAGNOSIS — R578 Other shock: Secondary | ICD-10-CM

## 2020-12-02 LAB — CULTURE, BLOOD (ROUTINE X 2)
Culture: NO GROWTH
Culture: NO GROWTH
Special Requests: ADEQUATE

## 2020-12-02 MED ORDER — BIOTENE DRY MOUTH MT LIQD
15.0000 mL | Freq: Two times a day (BID) | OROMUCOSAL | Status: DC
  Administered 2020-12-03: 15 mL via TOPICAL
  Filled 2020-12-02: qty 15

## 2020-12-02 NOTE — Care Management Important Message (Signed)
Important Message  Patient Details  Name: Katie Graves MRN: [id number] Date of Birth: [birth year]   Medicare Important Message Given:  Other (see comment)  On comfort care measures awaiting hospice bed.  Medicare IM withheld at this time out of respect for patient and family.    Dannette Barbara 12/02/2020, 8:24 AM

## 2020-12-02 NOTE — Progress Notes (Signed)
Daily Progress Note   Patient Name: Katie Graves       Date: 12/02/2020 DOB: Feb 08, 1922  Age: 87 y.o. MRN#: [id number] Attending Physician: Lorella Nimrod, MD Primary Care Physician: Tracie Harrier, MD Admit Date: 11/27/2020  Reason for Consultation/Follow-up: Terminal Care  Subjective: Patient sleeping, does not wake to voice or gentle touch. Daughter at bedside.   Length of Stay: 5  Current Medications: Scheduled Meds:  . lidocaine  1 patch Transdermal Q24H  . mouth rinse  15 mL Mouth Rinse BID  . pantoprazole (PROTONIX) IV  40 mg Intravenous Q12H    Continuous Infusions: . sodium chloride Stopped (11/29/20 1554)    PRN Meds: acetaminophen **OR** acetaminophen, antiseptic oral rinse, glycopyrrolate **OR** glycopyrrolate **OR** glycopyrrolate, haloperidol **OR** haloperidol **OR** haloperidol lactate, LORazepam **OR** LORazepam **OR** LORazepam, morphine CONCENTRATE **OR** morphine CONCENTRATE, ondansetron **OR** ondansetron (ZOFRAN) IV, polyvinyl alcohol  Physical Exam Constitutional:      General: She is not in acute distress.    Comments: Does not wake to voice, touch  Pulmonary:     Effort: Pulmonary effort is normal. No respiratory distress.  Skin:    General: Skin is warm and dry.             Vital Signs: BP (!) 101/44   Pulse 72   Temp 98 F (36.7 C) (Oral)   Resp 20   Ht 5\' 7"  (1.702 m)   Wt 59 kg   SpO2 95%   BMI 20.37 kg/m  SpO2: SpO2: 95 % O2 Device: O2 Device: Room Air O2 Flow Rate:    Intake/output summary:   Intake/Output Summary (Last 24 hours) at 12/02/2020 1137 Last data filed at 12/02/2020 0546 Gross per 24 hour  Intake 360 ml  Output 700 ml  Net -340 ml   LBM: Last BM Date: 11/25/20 Baseline Weight: Weight: 60 kg Most recent weight: Weight: 59  kg       Palliative Assessment/Data: PPS 20%    Flowsheet Rows   Flowsheet Row Most Recent Value  Intake Tab   Referral Department Hospitalist  Unit at Time of Referral Med/Surg Unit  Palliative Care Primary Diagnosis Trauma  Date Notified 11/28/20  Palliative Care Type New Palliative care  Reason for referral Clarify Goals of Care  Date of Admission 11/27/20  Date first seen by Palliative Care 11/29/20  # of days Palliative referral response time 1 Day(s)  # of days IP prior to Palliative referral 1  Clinical Assessment   Palliative Performance Scale Score 20%  Pain Max last 24 hours Not able to report  Pain Min Last 24 hours Not able to report  Dyspnea Max Last 24 Hours Not able to report  Dyspnea Min Last 24 hours Not able to report  Psychosocial & Spiritual Assessment   Palliative Care Outcomes       Patient Active Problem List   Diagnosis Date Noted  . Hemorrhagic shock ([place])   . Acute blood loss anemia (ABLA) 11/27/2020  . GI bleed 02/01/2017  . Essential hypertension 07/27/2016  . Basilar artery syndrome 07/27/2016  . Atrophic vaginitis 12/12/2015  . Aortic heart valve narrowing 11/08/2015  . Malignant neoplastic disease ([place]) 11/08/2015  . Type 2  diabetes mellitus ([place]) 11/08/2015  . Cerebrovascular accident (CVA) ([place]) 11/08/2015  . Accelerated hypertension 06/19/2015  . Carotid artery narrowing 09/06/2014    Palliative Care Assessment & Plan   HPI: 87 y.o. female  with past medical history of HOH, HTN, DM, MDD, hx CVA/  ED 3/20 w fall/weak, hx melena.  hemorrhagic shock 2/2 GIB and acute renal failure admitted on 11/27/2020 with blood loss anemia/spinal fractures.   Assessment: Daughter at bedside. Daughter feels patient is comfortable. Had pain yesterday - feels medication was adequate to provide comfort. She does share her concern that patient is sleeping too much - hopeful for more time with her awake. We discuss side effect of medication needed to  control her pain and also as she nears end of life she will spend more time asleep than awake.  Daughter also shares concern about patient not eating. We discuss not providing food/drink when patient is not awake enough to safely accept. Discussed mouth care with moist sponge. Discussed that most patients do not experience hunger and thirst as they near the end of life - natural process. She expresses understanding and gratitude for information. Again expresses desire to focus on patient's comfort and avoid any interventions that do not promote comfort.   Continues to be interested in hospice facility placement whenever bed is available.   Recommendations/Plan:  Continue full comfort measures  Educations provided to daughter and AMS, decreased PO intake as patient nears end of life  Hospice facility when bed available  Will schedule mouth care  Goals of Care and Additional Recommendations:  Limitations on Scope of Treatment: Full Comfort Care  Code Status:  DNR  Prognosis:   < 2 weeks  Discharge Planning:  Hospice facility  Care plan was discussed with RN and patient's daughter  Thank you for allowing the Palliative Medicine Team to assist in the care of this patient.   Total Time 25 minutes Prolonged Time Billed  no       Greater than 50%  of this time was spent counseling and coordinating care related to the above assessment and plan.  Juel Burrow, DNP, [facility] Palliative Medicine Team Team Phone # [phone]  Pager [phone]

## 2020-12-02 NOTE — Progress Notes (Signed)
PROGRESS NOTE  ICU transfer  Katie Graves  HQP:[id number] DOB: 06/28/1922 DOA: 11/27/2020 PCP: Tracie Harrier, MD   Brief Narrative: Taken from prior notes. 87 year old female with previous medical history of bilateral sensorineural hearing loss, essential hypertension, diabetes, major depressive disorder, history of CVA with basilar artery syndrome came in from home after family noted malaise fatigue with inability to walk post melanotic stools several times over the last few days starting Friday.  Patient came in profoundly hypotensive found to have severe anemia hemoglobin 4 with most recent at closer to 11 with circulatory shock.  Additional findings include AKI, transaminitis, altered mental status with confusion. Admitted with hemorrhagic shock, initially required pressors and received 5 unit of PRBCs. Hemoglobin improved to 13.4 and now again trending down, continue to have worsening renal function.  Off the pressors now with elevated blood pressure, palliative care was also consulted and today family decided about pursuing hospice home with a focus on comfort and dignity.  Subjective: Patient was sleeping when seen today.  I was able to wake her up but she appears lethargic, give some smile and went back to sleep.  Daughter at bedside. Patient did received a dose of morphine and Ativan earlier this morning secondary to some morning and agitation.  Assessment & Plan:   Active Problems:   Acute blood loss anemia (ABLA)   Hemorrhagic shock (HCC)  Acute blood loss anemia.  Patient did not had any more BM per nursing staff.  Hemoglobin improved to 13.4 and decreased to 11.9 and remained stable with today's reading of 12.  No other obvious bleeding. Patient received total of 5 units of PRBC.  Baseline hemoglobin around 11. GI was also consulted-no plan for any EGD as risk outweighs the benefit at this time. -Continue holding home dose of aspirin and Plavix-patient should be taken off  as very little benefit in this age group. -Continue with twice daily Protonix. -Patient is now comfort care-no escalation of care, comfort care orders are in-waiting for hospice home bed availability.  NSTEMI.  Patient denies any chest pain with troponin more than 27,000, started trending down.  EKG with ST depression in inferolateral leads with the setting of hemorrhagic shock.  This makes her in the category of NSTEMI.  Cardiology was consulted and she is not a candidate for any intervention and they are recommending focus on comfort only.  Rhabdomyolysis.  CK elevated at 1100 and now back to baseline -Monitor CK  Dysphagia.  Apparently they chronic issue with recurrent regurgitation. GI does not want any procedure. -Conservative management.  AKI with CKD stage IIIb.  Most likely secondary to hemorrhagic shock. Renal function with some mild worsening.  Baseline creatinine around 1.4-1.6.  Today it was 3.71 with BUN of 126. -We will not do more labs as focus has been shifted to comfort care only.  Leukocytosis.  Most likely reactive secondary to hemorrhagic shock.  Improving.  No obvious source of infection and blood cultures remain negative.  Transaminitis.  Most likely secondary to shock liver with hemorrhagic shock. Now improving.  Hypertension.  Blood pressure within goal, just with restarting hydralazine.  On multiple antihypertensives at home which includes losartan, verapamil, Lasix and hydralazine. -Continue home dose of hydralazine. -Keep holding Lasix and losartan due to AKI. -Will add verapamil if needed.  Type 2 diabetes mellitus.  Patient was on Amaryl at home. -SSI for now.  Protein caloric malnutrition.  Patient appears malnourished with mildly decreased total protein and albumin. Estimated body mass  index is 20.37 kg/m as calculated from the following:   Height as of this encounter: 5\' 7"  (1.702 m).   Weight as of this encounter: 59 kg.  -Dietitian  consult  Patient is very high risk for deterioration and death secondary to advanced age, multiple comorbidities, NSTEMI and worsening renal function at this time.  Not a suitable candidate for any aggressive measures.  Family decided to proceed with comfort measures only.  Referral was provided by palliative care for hospice home.  No bed availability today.  Objective: Vitals:   11/30/20 1001 11/30/20 1006 11/30/20 1132 12/02/20 0754  BP: (!) 82/56 (!) 82/58 119/67 (!) 101/44  Pulse: 80  78 72  Resp: 16  20   Temp: 98 F (36.7 C)  97.7 F (36.5 C) 98 F (36.7 C)  TempSrc: Oral  Oral Oral  SpO2: 96%  97% 95%  Weight:      Height:        Intake/Output Summary (Last 24 hours) at 12/02/2020 1302 Last data filed at 12/02/2020 0546 Gross per 24 hour  Intake 360 ml  Output 700 ml  Net -340 ml   Filed Weights   11/27/20 1228 11/28/20 0500 11/30/20 0619  Weight: 60 kg 56.7 kg 59 kg    Examination:  General.  Lethargic appearing elderly lady, in no acute distress. Pulmonary.  Lungs clear bilaterally, normal respiratory effort. CV.  Regular rate and rhythm, no JVD, rub or murmur. Abdomen.  Soft, nontender, nondistended, BS positive. CNS.  Sleeping, no apparent focal deficit. Extremities.  No edema, no cyanosis, pulses intact and symmetrical. Psychiatry.  Judgment and insight appears normal.  DVT prophylaxis: SCDs Code Status: DNR Family Communication: Daughter was updated at bedside Disposition Plan:  Status is: Inpatient  Remains inpatient appropriate because:Inpatient level of care appropriate due to severity of illness   Dispo: The patient is from: Home              Anticipated d/c is to: Hospice home              Patient currently is not medically stable to d/c.   Difficult to place patient No              Level of care: Med-Surg  All the records are reviewed and case discussed with Care Management/Social Worker. Management plans discussed with the patient,  nursing and they are in agreement.  Consultants:   PCCM  GI  Procedures:  Antimicrobials:   Data Reviewed: I have personally reviewed following labs and imaging studies  CBC: Recent Labs  Lab 11/27/20 1236 11/27/20 1907 11/27/20 2356 11/28/20 0436 11/29/20 0348 11/30/20 0515  WBC 19.3*  --   --  17.8* 14.5* 11.8*  NEUTROABS 16.9*  --   --   --   --   --   HGB 4.8* 13.1 12.5 13.4 11.9* 12.0  HCT 16.0* 38.3 37.4 40.5 35.2* 36.6  MCV 79.6*  --   --  84.0 83.4 86.5  PLT 277  --   --  209 141* 338*   Basic Metabolic Panel: Recent Labs  Lab 11/27/20 1236 11/28/20 0436 11/29/20 0348 11/30/20 0515  NA 139 138 144 141  K 4.3 4.0 3.8 3.9  CL 103 105 113* 111  CO2 11* 17* 18* 19*  GLUCOSE 155* 272* 147* 109*  BUN 108* 107* 135* 126*  CREATININE 3.40* 3.48* 3.94* 3.71*  CALCIUM 9.1 8.3* 8.1* 8.5*  MG  --  2.2 2.1 2.2  PHOS  --  7.2* 6.4* 5.8*   GFR: Estimated Creatinine Clearance: 8.1 mL/min (A) (by C-G formula based on SCr of 3.71 mg/dL (H)). Liver Function Tests: Recent Labs  Lab 11/27/20 1236 11/28/20 0436 11/29/20 0348 11/30/20 0515  AST 499* 909*  --  343*  ALT 195* 555*  --  403*  ALKPHOS 70 85  --  70  BILITOT 0.7 0.9  --  0.9  PROT 5.6* 6.0*  --  4.9*  ALBUMIN 3.1* 3.3* 2.7* 2.6*   No results for input(s): LIPASE, AMYLASE in the last 168 hours. No results for input(s): AMMONIA in the last 168 hours. Coagulation Profile: No results for input(s): INR, PROTIME in the last 168 hours. Cardiac Enzymes: Recent Labs  Lab 11/28/20 0436 11/30/20 0515  CKTOTAL 1,136* 105   BNP (last 3 results) No results for input(s): PROBNP in the last 8760 hours. HbA1C: No results for input(s): HGBA1C in the last 72 hours. CBG: Recent Labs  Lab 11/30/20 0405 11/30/20 0451 11/30/20 0747 11/30/20 1146 11/30/20 1729  GLUCAP 58* 111* 87 148* 119*   Lipid Profile: No results for input(s): CHOL, HDL, LDLCALC, TRIG, CHOLHDL, LDLDIRECT in the last 72 hours. Thyroid  Function Tests: No results for input(s): TSH, T4TOTAL, FREET4, T3FREE, THYROIDAB in the last 72 hours. Anemia Panel: No results for input(s): VITAMINB12, FOLATE, FERRITIN, TIBC, IRON, RETICCTPCT in the last 72 hours. Sepsis Labs: Recent Labs  Lab 11/27/20 1236 11/27/20 1701 11/28/20 0436  LATICACIDVEN >11.0* 6.0* 2.4*    Recent Results (from the past 240 hour(s))  Culture, blood (routine x 2)     Status: None   Collection Time: 11/27/20 12:36 PM   Specimen: BLOOD  Result Value Ref Range Status   Specimen Description BLOOD RIGHT ANTECUBITAL  Final   Special Requests   Final    BOTTLES DRAWN AEROBIC AND ANAEROBIC Blood Culture results may not be optimal due to an inadequate volume of blood received in culture bottles   Culture   Final    NO GROWTH 5 DAYS Performed at [facility], [address], [place], [place] [address]    Report Status 12/02/2020 FINAL  Final  Culture, blood (routine x 2)     Status: None   Collection Time: 11/27/20 12:39 PM   Specimen: BLOOD  Result Value Ref Range Status   Specimen Description BLOOD LEFT ANTECUBITAL  Final   Special Requests   Final    BOTTLES DRAWN AEROBIC AND ANAEROBIC Blood Culture adequate volume   Culture   Final    NO GROWTH 5 DAYS Performed at [facility], [address] [place], [place] [address]    Report Status 12/02/2020 FINAL  Final  Resp Panel by RT-PCR (Flu A&B, Covid) Nasopharyngeal Swab     Status: None   Collection Time: 11/27/20 12:45 PM   Specimen: Nasopharyngeal Swab; Nasopharyngeal(NP) swabs in vial transport medium  Result Value Ref Range Status   SARS Coronavirus 2 by RT PCR NEGATIVE NEGATIVE Final    Comment: (NOTE) SARS-CoV-2 target nucleic acids are NOT DETECTED.  The SARS-CoV-2 RNA is generally detectable in upper respiratory specimens during the acute phase of infection. The lowest concentration of SARS-CoV-2 viral copies this assay can detect is 138 copies/mL. A negative result  does not preclude SARS-Cov-2 infection and should not be used as the sole basis for treatment or other patient management decisions. A negative result may occur with  improper specimen collection/handling, submission of specimen other than nasopharyngeal swab, presence of viral mutation(s) within  the areas targeted by this assay, and inadequate number of viral copies(<138 copies/mL). A negative result must be combined with clinical observations, patient history, and epidemiological information. The expected result is Negative.  Fact Sheet for Patients:  EntrepreneurPulse.com.au  Fact Sheet for Healthcare Providers:  IncredibleEmployment.be  This test is no t yet approved or cleared by the [place] FDA and  has been authorized for detection and/or diagnosis of SARS-CoV-2 by FDA under an Emergency Use Authorization (EUA). This EUA will remain  in effect (meaning this test can be used) for the duration of the COVID-19 declaration under Section 564(b)(1) of the Act, 21 U.S.C.section 360bbb-3(b)(1), unless the authorization is terminated  or revoked sooner.       Influenza A by PCR NEGATIVE NEGATIVE Final   Influenza B by PCR NEGATIVE NEGATIVE Final    Comment: (NOTE) The Xpert Xpress SARS-CoV-2/FLU/RSV plus assay is intended as an aid in the diagnosis of influenza from Nasopharyngeal swab specimens and should not be used as a sole basis for treatment. Nasal washings and aspirates are unacceptable for Xpert Xpress SARS-CoV-2/FLU/RSV testing.  Fact Sheet for Patients: EntrepreneurPulse.com.au  Fact Sheet for Healthcare Providers: IncredibleEmployment.be  This test is not yet approved or cleared by the [place] FDA and has been authorized for detection and/or diagnosis of SARS-CoV-2 by FDA under an Emergency Use Authorization (EUA). This EUA will remain in effect (meaning this test can be used) for  the duration of the COVID-19 declaration under Section 564(b)(1) of the Act, 21 U.S.C. section 360bbb-3(b)(1), unless the authorization is terminated or revoked.  Performed at [facility], [address] [place], [place] [address]   MRSA PCR Screening     Status: None   Collection Time: 11/27/20  5:48 PM   Specimen: Nasopharyngeal  Result Value Ref Range Status   MRSA by PCR NEGATIVE NEGATIVE Final    Comment:        The GeneXpert MRSA Assay (FDA approved for NASAL specimens only), is one component of a comprehensive MRSA colonization surveillance program. It is not intended to diagnose MRSA infection nor to guide or monitor treatment for MRSA infections. Performed at [facility], [address] [place], [place] [address]      Radiology Studies: No results found.  Scheduled Meds: . antiseptic oral rinse  15 mL Topical BID  . lidocaine  1 patch Transdermal Q24H  . mouth rinse  15 mL Mouth Rinse BID  . pantoprazole (PROTONIX) IV  40 mg Intravenous Q12H   Continuous Infusions: . sodium chloride Stopped (11/29/20 1554)     LOS: 5 days   Time spent: 30 minutes. More than 50% of the time was spent in counseling/coordination of care  Lorella Nimrod, MD [facility]  If 7PM-7AM, please contact night-coverage [email or web address]  12/02/2020, 1:02 PM   This record has been created using Systems analyst. Errors have been sought and corrected,but may not always be located. Such creation errors do not reflect on the standard of care.

## 2020-12-02 NOTE — Progress Notes (Signed)
Nurse tech, Butch Penny was able to feed patient applesauce and 2 containers of thickened liquid.  Patient tolerated food well.  There was also 700 mL of urine output in foley last night.  Will continue to monitor.  Christene Slates  12/02/2020 6:31 AM

## 2020-12-02 NOTE — Progress Notes (Signed)
[facility] Liaison note:   Visit made to new referral for [facility] hospice home. Patient seen lying in bed, daughter Nevin Bloodgood at bedside. Nevin Bloodgood and hospital care team notified that [facility] is unable to offer a bed today. Nevin Bloodgood voiced understanding. Patient with some moaning noted and facial grimace. Nevin Bloodgood feels that her mother is "transitioning" and does not feel that she is uncomfortable. Patient did receive a PRN dose of Lorazepam 1 mg IV and 5 mg of oral morphine this morning. Discussed symptom management with staff RN' [place] dn Lisabeth Pick. Education to family continues regarding symptom management. If a bed does become available over the weekend the hospice home staff will contact the family and the hospital care team. Thank you. Flo Shanks BSN, RN, [place] [phone]

## 2020-12-03 DIAGNOSIS — K922 Gastrointestinal hemorrhage, unspecified: Secondary | ICD-10-CM | POA: Diagnosis not present

## 2020-12-03 DIAGNOSIS — D62 Acute posthemorrhagic anemia: Secondary | ICD-10-CM | POA: Diagnosis not present

## 2020-12-03 NOTE — Progress Notes (Signed)
PROGRESS NOTE  ICU transfer  Katie Graves  DGU:[id number] DOB: 1922-01-12 DOA: 11/27/2020 PCP: Tracie Harrier, MD   Brief Narrative: Taken from prior notes. 87 year old female with previous medical history of bilateral sensorineural hearing loss, essential hypertension, diabetes, major depressive disorder, history of CVA with basilar artery syndrome came in from home after family noted malaise fatigue with inability to walk post melanotic stools several times over the last few days starting Friday.  Patient came in profoundly hypotensive found to have severe anemia hemoglobin 4 with most recent at closer to 11 with circulatory shock.  Additional findings include AKI, transaminitis, altered mental status with confusion. Admitted with hemorrhagic shock, initially required pressors and received 5 unit of PRBCs. Hemoglobin improved to 13.4 and now again trending down, continue to have worsening renal function.  Off the pressors now with elevated blood pressure, palliative care was also consulted and today family decided about pursuing hospice home with a focus on comfort and dignity.  Subjective: Patient was sleeping comfortably when I entered the room.  When woke up she started morning.  Following some commands.  Assessment & Plan:   Active Problems:   Acute blood loss anemia (ABLA)   Hemorrhagic shock (HCC)  Acute blood loss anemia.  Patient did not had any more BM per nursing staff.  Hemoglobin improved to 13.4 and decreased to 11.9 and remained stable with today's reading of 12.  No other obvious bleeding. Patient received total of 5 units of PRBC.  Baseline hemoglobin around 11. GI was also consulted-no plan for any EGD as risk outweighs the benefit at this time. -Continue holding home dose of aspirin and Plavix-patient should be taken off as very little benefit in this age group. -Continue with twice daily Protonix. -Patient is now comfort care-no escalation of care, comfort care  orders are in-waiting for hospice home bed availability.  NSTEMI.  Patient denies any chest pain with troponin more than 27,000, started trending down.  EKG with ST depression in inferolateral leads with the setting of hemorrhagic shock.  This makes her in the category of NSTEMI.  Cardiology was consulted and she is not a candidate for any intervention and they are recommending focus on comfort only.  Rhabdomyolysis.  CK elevated at 1100 and now back to baseline -Monitor CK  Dysphagia.  Apparently they chronic issue with recurrent regurgitation. GI does not want any procedure. -Conservative management.  AKI with CKD stage IIIb.  Most likely secondary to hemorrhagic shock. Renal function with some mild worsening.  Baseline creatinine around 1.4-1.6.  Today it was 3.71 with BUN of 126. -We will not do more labs as focus has been shifted to comfort care only.  Leukocytosis.  Most likely reactive secondary to hemorrhagic shock.  Improving.  No obvious source of infection and blood cultures remain negative.  Transaminitis.  Most likely secondary to shock liver with hemorrhagic shock. Now improving.  Hypertension.  Blood pressure within goal, just with restarting hydralazine.  On multiple antihypertensives at home which includes losartan, verapamil, Lasix and hydralazine. -Continue home dose of hydralazine. -Keep holding Lasix and losartan due to AKI. -Will add verapamil if needed.  Type 2 diabetes mellitus.  Patient was on Amaryl at home. -SSI for now.  Protein caloric malnutrition.  Patient appears malnourished with mildly decreased total protein and albumin. Estimated body mass index is 20.37 kg/m as calculated from the following:   Height as of this encounter: 5\' 7"  (1.702 m).   Weight as of this encounter: 64  kg.  -Dietitian consult  Patient is very high risk for deterioration and death secondary to advanced age, multiple comorbidities, NSTEMI and worsening renal function at this  time.  Not a suitable candidate for any aggressive measures.  Family decided to proceed with comfort measures only.  Referral was provided by palliative care for hospice home.  No bed availability today.  Objective: Vitals:   11/30/20 1006 11/30/20 1132 12/02/20 0754 12/03/20 0811  BP: (!) 82/58 119/67 (!) 101/44 (!) 155/81  Pulse:  78 72 85  Resp:  20  20  Temp:  97.7 F (36.5 C) 98 F (36.7 C) 97.6 F (36.4 C)  TempSrc:  Oral Oral Oral  SpO2:  97% 95% 96%  Weight:      Height:        Intake/Output Summary (Last 24 hours) at 12/03/2020 1950 Last data filed at 12/02/2020 2253 Gross per 24 hour  Intake --  Output 700 ml  Net -700 ml   Filed Weights   11/27/20 1228 11/28/20 0500 11/30/20 0619  Weight: 60 kg 56.7 kg 59 kg    Examination:  General.  Lethargic appearing elderly lady, in no acute distress. Pulmonary.  Lungs clear bilaterally, normal respiratory effort. CV.  Regular rate and rhythm, no JVD, rub or murmur. Abdomen.  Soft, nontender, nondistended, BS positive. CNS.  Alert and oriented x3.  No focal neurologic deficit. Extremities.  No edema, no cyanosis, pulses intact and symmetrical. Psychiatry.  Judgment and insight appears impaired.  DVT prophylaxis: SCDs Code Status: DNR Family Communication: No family at bedside today. Disposition Plan:  Status is: Inpatient  Remains inpatient appropriate because:Inpatient level of care appropriate due to severity of illness   Dispo: The patient is from: Home              Anticipated d/c is to: Hospice home              Patient currently is not medically stable to d/c.   Difficult to place patient No              Level of care: Med-Surg  All the records are reviewed and case discussed with Care Management/Social Worker. Management plans discussed with the patient, nursing and they are in agreement.  Consultants:   PCCM  GI  Procedures:  Antimicrobials:   Data Reviewed: I have personally reviewed  following labs and imaging studies  CBC: Recent Labs  Lab 11/27/20 1236 11/27/20 1907 11/27/20 2356 11/28/20 0436 11/29/20 0348 11/30/20 0515  WBC 19.3*  --   --  17.8* 14.5* 11.8*  NEUTROABS 16.9*  --   --   --   --   --   HGB 4.8* 13.1 12.5 13.4 11.9* 12.0  HCT 16.0* 38.3 37.4 40.5 35.2* 36.6  MCV 79.6*  --   --  84.0 83.4 86.5  PLT 277  --   --  209 141* 932*   Basic Metabolic Panel: Recent Labs  Lab 11/27/20 1236 11/28/20 0436 11/29/20 0348 11/30/20 0515  NA 139 138 144 141  K 4.3 4.0 3.8 3.9  CL 103 105 113* 111  CO2 11* 17* 18* 19*  GLUCOSE 155* 272* 147* 109*  BUN 108* 107* 135* 126*  CREATININE 3.40* 3.48* 3.94* 3.71*  CALCIUM 9.1 8.3* 8.1* 8.5*  MG  --  2.2 2.1 2.2  PHOS  --  7.2* 6.4* 5.8*   GFR: Estimated Creatinine Clearance: 8.1 mL/min (A) (by C-G formula based on SCr of 3.71 mg/dL (H)).  Liver Function Tests: Recent Labs  Lab 11/27/20 1236 11/28/20 0436 11/29/20 0348 11/30/20 0515  AST 499* 909*  --  343*  ALT 195* 555*  --  403*  ALKPHOS 70 85  --  70  BILITOT 0.7 0.9  --  0.9  PROT 5.6* 6.0*  --  4.9*  ALBUMIN 3.1* 3.3* 2.7* 2.6*   No results for input(s): LIPASE, AMYLASE in the last 168 hours. No results for input(s): AMMONIA in the last 168 hours. Coagulation Profile: No results for input(s): INR, PROTIME in the last 168 hours. Cardiac Enzymes: Recent Labs  Lab 11/28/20 0436 11/30/20 0515  CKTOTAL 1,136* 105   BNP (last 3 results) No results for input(s): PROBNP in the last 8760 hours. HbA1C: No results for input(s): HGBA1C in the last 72 hours. CBG: Recent Labs  Lab 11/30/20 0405 11/30/20 0451 11/30/20 0747 11/30/20 1146 11/30/20 1729  GLUCAP 58* 111* 87 148* 119*   Lipid Profile: No results for input(s): CHOL, HDL, LDLCALC, TRIG, CHOLHDL, LDLDIRECT in the last 72 hours. Thyroid Function Tests: No results for input(s): TSH, T4TOTAL, FREET4, T3FREE, THYROIDAB in the last 72 hours. Anemia Panel: No results for input(s):  VITAMINB12, FOLATE, FERRITIN, TIBC, IRON, RETICCTPCT in the last 72 hours. Sepsis Labs: Recent Labs  Lab 11/27/20 1236 11/27/20 1701 11/28/20 0436  LATICACIDVEN >11.0* 6.0* 2.4*    Recent Results (from the past 240 hour(s))  Culture, blood (routine x 2)     Status: None   Collection Time: 11/27/20 12:36 PM   Specimen: BLOOD  Result Value Ref Range Status   Specimen Description BLOOD RIGHT ANTECUBITAL  Final   Special Requests   Final    BOTTLES DRAWN AEROBIC AND ANAEROBIC Blood Culture results may not be optimal due to an inadequate volume of blood received in culture bottles   Culture   Final    NO GROWTH 5 DAYS Performed at [facility], [address], [place], [place] [address]    Report Status 12/02/2020 FINAL  Final  Culture, blood (routine x 2)     Status: None   Collection Time: 11/27/20 12:39 PM   Specimen: BLOOD  Result Value Ref Range Status   Specimen Description BLOOD LEFT ANTECUBITAL  Final   Special Requests   Final    BOTTLES DRAWN AEROBIC AND ANAEROBIC Blood Culture adequate volume   Culture   Final    NO GROWTH 5 DAYS Performed at [facility], [address] [place], [place] [address]    Report Status 12/02/2020 FINAL  Final  Resp Panel by RT-PCR (Flu A&B, Covid) Nasopharyngeal Swab     Status: None   Collection Time: 11/27/20 12:45 PM   Specimen: Nasopharyngeal Swab; Nasopharyngeal(NP) swabs in vial transport medium  Result Value Ref Range Status   SARS Coronavirus 2 by RT PCR NEGATIVE NEGATIVE Final    Comment: (NOTE) SARS-CoV-2 target nucleic acids are NOT DETECTED.  The SARS-CoV-2 RNA is generally detectable in upper respiratory specimens during the acute phase of infection. The lowest concentration of SARS-CoV-2 viral copies this assay can detect is 138 copies/mL. A negative result does not preclude SARS-Cov-2 infection and should not be used as the sole basis for treatment or other patient management decisions. A negative  result may occur with  improper specimen collection/handling, submission of specimen other than nasopharyngeal swab, presence of viral mutation(s) within the areas targeted by this assay, and inadequate number of viral copies(<138 copies/mL). A negative result must be combined with clinical observations,  patient history, and epidemiological information. The expected result is Negative.  Fact Sheet for Patients:  EntrepreneurPulse.com.au  Fact Sheet for Healthcare Providers:  IncredibleEmployment.be  This test is no t yet approved or cleared by the [place] FDA and  has been authorized for detection and/or diagnosis of SARS-CoV-2 by FDA under an Emergency Use Authorization (EUA). This EUA will remain  in effect (meaning this test can be used) for the duration of the COVID-19 declaration under Section 564(b)(1) of the Act, 21 U.S.C.section 360bbb-3(b)(1), unless the authorization is terminated  or revoked sooner.       Influenza A by PCR NEGATIVE NEGATIVE Final   Influenza B by PCR NEGATIVE NEGATIVE Final    Comment: (NOTE) The Xpert Xpress SARS-CoV-2/FLU/RSV plus assay is intended as an aid in the diagnosis of influenza from Nasopharyngeal swab specimens and should not be used as a sole basis for treatment. Nasal washings and aspirates are unacceptable for Xpert Xpress SARS-CoV-2/FLU/RSV testing.  Fact Sheet for Patients: EntrepreneurPulse.com.au  Fact Sheet for Healthcare Providers: IncredibleEmployment.be  This test is not yet approved or cleared by the [place] FDA and has been authorized for detection and/or diagnosis of SARS-CoV-2 by FDA under an Emergency Use Authorization (EUA). This EUA will remain in effect (meaning this test can be used) for the duration of the COVID-19 declaration under Section 564(b)(1) of the Act, 21 U.S.C. section 360bbb-3(b)(1), unless the authorization is  terminated or revoked.  Performed at [facility], [address] [place], [place] [address]   MRSA PCR Screening     Status: None   Collection Time: 11/27/20  5:48 PM   Specimen: Nasopharyngeal  Result Value Ref Range Status   MRSA by PCR NEGATIVE NEGATIVE Final    Comment:        The GeneXpert MRSA Assay (FDA approved for NASAL specimens only), is one component of a comprehensive MRSA colonization surveillance program. It is not intended to diagnose MRSA infection nor to guide or monitor treatment for MRSA infections. Performed at [facility], [address] [place], [place] [address]      Radiology Studies: No results found.  Scheduled Meds: . antiseptic oral rinse  15 mL Topical BID  . lidocaine  1 patch Transdermal Q24H  . mouth rinse  15 mL Mouth Rinse BID  . pantoprazole (PROTONIX) IV  40 mg Intravenous Q12H   Continuous Infusions: . sodium chloride Stopped (11/29/20 1554)     LOS: 6 days   Time spent: 30 minutes. More than 50% of the time was spent in counseling/coordination of care  Lorella Nimrod, MD [facility]  If 7PM-7AM, please contact night-coverage [email or web address]  12/03/2020, 8:14 AM   This record has been created using Systems analyst. Errors have been sought and corrected,but may not always be located. Such creation errors do not reflect on the standard of care.

## 2020-12-03 NOTE — Progress Notes (Signed)
Pt resting peacefully.  Son at the bedside.  Denies needs or concerns.

## 2020-12-03 NOTE — TOC Progression Note (Addendum)
Transition of Care [facility]) - Progression Note    Patient Details  Name: ELLER SWEIS MRN: [id number] Date of Birth: 06-15-1922  Transition [facility] CM/SW Contact  Elliot Gurney [place], [place] Phone Number: [phone] 12/03/2020, 10:19 AM  Clinical Narrative:     Phone call to [place] message for triage nurse to discuss hospice bed availability.  10:33am Return call from [facility] triage nurse-there is no bed available today. She recommended checking for availability tomorrow.  .  Transition of Care to continue to follow   [facility], LCSW Transition of Care [phone]        Expected Discharge Plan and Services                                                 Social Determinants of Health (SDOH) Interventions    Readmission Risk Interventions No flowsheet data found.

## 2020-12-04 DIAGNOSIS — K922 Gastrointestinal hemorrhage, unspecified: Secondary | ICD-10-CM | POA: Diagnosis not present

## 2020-12-04 DIAGNOSIS — R578 Other shock: Secondary | ICD-10-CM | POA: Diagnosis not present

## 2020-12-04 MED ORDER — PANTOPRAZOLE SODIUM 40 MG PO TBEC: 40.0000 mg | DELAYED_RELEASE_TABLET | Freq: Every day | ORAL | 1 refills | Status: AC

## 2020-12-04 NOTE — TOC Transition Note (Signed)
Transition of Care [facility]) - CM/SW Discharge Note   Patient Details  Name: Katie Graves MRN: [id number] Date of Birth: 1922-06-29  [facility] CM/SW Contact:  Truitt Merle, LCSW Phone Number: 12/04/2020, 3:30 PM   Clinical Narrative:    Patient to transport via EMS to [facility] today. TOC verified with [facility] [place] [phone]) that bed is available, discharge paperwork received, and she arranged EMS. CSW updated RN Darrick Penna and Claiborne Billings aware.   3:35pm: Received a chat that medical necessity form is needed. TOC brought to the floor. However, RN Freda Munro had already provided to EMS. No further TOC needs at this time.          Patient Goals and CMS Choice        Discharge Placement                       Discharge Plan and Services                                     Social Determinants of Health (SDOH) Interventions     Readmission Risk Interventions No flowsheet data found.

## 2020-12-04 NOTE — Discharge Summary (Signed)
Physician Discharge Summary  SANDEEP DELAGARZA TDD:[id number] DOB: [birth year] DOA: 11/27/2020  PCP: Tracie Harrier, MD  Admit date: 11/27/2020 Discharge date: 12/04/2020  Admitted From: Home Disposition: Inpatient hospice facility  Recommendations for Outpatient Follow-up:  1. Follow up with PCP in 1-2 weeks 2. Please obtain BMP/CBC in one week 3. Please follow up on the following pending results: None  Home Health: No Equipment/Devices: None Discharge Condition: Guarded CODE STATUS: DNR Diet recommendation: Heart Healthy / Carb Modified / Regular / Dysphagia   Brief/Interim Summary: 87 year old female with previous medical history of bilateral sensorineural hearing loss, essential hypertension, diabetes, major depressive disorder, history of CVA with basilar artery syndrome came in from home after family noted malaise fatigue with inability to walk post melanotic stools several times over the last few days starting Friday. Patient came in profoundly hypotensive found to have severe anemia hemoglobin 4 with most recent at closer to 11 with circulatory shock. Additional findings include AKI, transaminitis, altered mental status with confusion. Admitted with hemorrhagic shock, initially required pressors and received 5 unit of PRBCs. Initially required pressors.  She came off of pressors and blood pressure trending up. GI was consulted but patient was very high risk for any procedure.  She was just treated with IV Protonix and can continue p.o. if able to take it.  She also found to have troponin more than 27,000 which makes her NSTEMI, T wave inversions in inferior lateral leads.  Because of her age and other comorbidities he was not a candidate for any further intervention by cardiology.  Patient also had rhabdomyolysis most likely contributing to her renal failure, CK has been normalized.  We involved palliative care because of her advanced age along with multiple comorbidities and  family decided to transition her to comfort care only.  Patient currently with fluctuating mentation, requiring few doses of morphine as she started moaning and appears uncomfortable.  Not much significant p.o. intake.  Patient is being discharged to inpatient hospice facility for further end-of-life management.  Discharge Diagnoses:  Active Problems:   Acute blood loss anemia (ABLA)   Hemorrhagic shock [facility])  Discharge Instructions  Discharge Instructions    Diet - low sodium heart healthy   Complete by: As directed    Increase activity slowly   Complete by: As directed      Allergies as of 12/04/2020      Reactions   Lidocaine Nausea Only   Other reaction(s): Unknown   Novocain [procaine] Nausea Only   Penicillins    Has patient had a PCN reaction causing immediate rash, facial/tongue/throat swelling, SOB or lightheadedness with hypotension: no Has patient had a PCN reaction causing severe rash involving mucus membranes or skin necrosis: yes Has patient had a PCN reaction that required hospitalization no Has patient had a PCN reaction occurring within the last 10 years: no If all of the above answers are "NO", then may proceed with Cephalosporin use.   Sulfa Antibiotics Nausea Only   Vancomycin Nausea Only   Other reaction(s): Unknown   Codeine Rash      Medication List    STOP taking these medications   clopidogrel 75 MG tablet Commonly known as: PLAVIX   furosemide 20 MG tablet Commonly known as: LASIX   glimepiride 2 MG tablet Commonly known as: AMARYL   hydrALAZINE 25 MG tablet Commonly known as: APRESOLINE   losartan 100 MG tablet Commonly known as: COZAAR   multivitamin with minerals tablet   verapamil 120 MG CR tablet  Commonly known as: CALAN-SR     TAKE these medications   pantoprazole 40 MG tablet Commonly known as: Protonix Take 1 tablet (40 mg total) by mouth daily.       Allergies  Allergen Reactions  . Lidocaine Nausea Only     Other reaction(s): Unknown  . Novocain [Procaine] Nausea Only  . Penicillins     Has patient had a PCN reaction causing immediate rash, facial/tongue/throat swelling, SOB or lightheadedness with hypotension: no Has patient had a PCN reaction causing severe rash involving mucus membranes or skin necrosis: yes Has patient had a PCN reaction that required hospitalization no Has patient had a PCN reaction occurring within the last 10 years: no If all of the above answers are "NO", then may proceed with Cephalosporin use.   . Sulfa Antibiotics Nausea Only  . Vancomycin Nausea Only    Other reaction(s): Unknown  . Codeine Rash    Consultations:  Cardiology  GI  Palliative care  Procedures/Studies: CT HEAD WO CONTRAST  Result Date: 11/28/2020 CLINICAL DATA:  Head trauma, loss of consciousness. Additional provided: Hemorrhagic shock. EXAM: CT HEAD WITHOUT CONTRAST TECHNIQUE: Contiguous axial images were obtained from the base of the skull through the vertex without intravenous contrast. COMPARISON:  Brain MRI 06/10/2019. CT angiogram head/neck 06/19/2015. Head CT 06/19/2013. FINDINGS: Brain: Moderate to moderately advanced cerebral atrophy. Comparatively mild cerebellar atrophy. Known chronic small vessel infarcts within the bilateral cerebral white matter, right basal ganglia, left thalamus, right thalamocapsular junction and left pons. Additional small chronic infarcts within the bilateral cerebellar hemispheres Background ill-defined hypoattenuation within the cerebral white matter is nonspecific, but compatible with chronic small vessel ischemic disease. There is no acute intracranial hemorrhage. No demarcated cortical infarct. No extra-axial fluid collection. No evidence of intracranial mass. No midline shift. Vascular: No hyperdense vessel.  Atherosclerotic calcifications. Skull: Normal. Negative for fracture or focal lesion. Sinuses/Orbits: Visualized orbits show no acute finding. Trace  bilateral ethmoid sinus mucosal thickening. Small right mastoid effusion. Other: Anterior subluxation of the mandibular condyles bilaterally. IMPRESSION: No evidence of acute intracranial abnormality. Stable parenchymal atrophy and chronic small vessel ischemic disease. Redemonstrated chronic infarcts within the bilateral cerebral white matter, right basal ganglia, left thalamus, right thalamocapsular junction and left pons. Additional known chronic infarcts within the bilateral cerebellar hemispheres. Anterior subluxation of the mandibular condyles bilaterally, which may be related to patient positioning at the time of the examination. Correlate with physical exam to exclude TMJ dislocation. Small right mastoid effusion. Electronically Signed   By: Kellie Simmering DO   On: 11/28/2020 19:44   DG Chest Portable 1 View  Result Date: 11/27/2020 CLINICAL DATA:  Multiple falls and weakness for 2 days. EXAM: PORTABLE CHEST 1 VIEW COMPARISON:  Chest radiograph dated 11/05/2016. FINDINGS: The heart size is normal. Vascular calcifications are seen in the aortic arch. Both lungs are clear. Degenerative changes are seen in the spine. IMPRESSION: No active disease. Electronically Signed   By: Zerita Boers M.D.   On: 11/27/2020 13:41   CT CHEST ABDOMEN PELVIS WO CONTRAST  Result Date: 11/27/2020 CLINICAL DATA:  Mid-back pain fall history with ongoing back pain EXAM: CT CHEST, ABDOMEN AND PELVIS WITHOUT CONTRAST TECHNIQUE: Multidetector CT imaging of the chest, abdomen and pelvis was performed following the standard protocol without IV contrast. COMPARISON:  CT abdomen pelvis 01/28/2017 FINDINGS: CT CHEST FINDINGS Cardiovascular: Enlarged left atrium. Likely enlarged left ventricle. Severe aortic valve leaflet calcifications. Severe mitral annular calcifications. No significant pericardial effusion. The thoracic aorta is  normal in caliber. Severe atherosclerotic plaque of the thoracic aorta. Mild to moderate three-vessel  coronary artery calcifications. Mediastinum/Nodes: No enlarged mediastinal or axillary lymph nodes. No gross hilar adenopathy, noting limited sensitivity for the detection of hilar adenopathy on this noncontrast study. There is a 1.1 cm hypodense lesion within the right thyroid gland. Trachea demonstrate no significant findings. Question esophageal wall thickening with limited evaluation on this noncontrast study. Air-fluid level within the esophageal lumen. Lungs/Pleura: No focal consolidation. No pulmonary nodule. No pulmonary mass. Subsegmental atelectasis appear bilateral trace pleural effusions. No pneumothorax. Musculoskeletal: No chest wall abnormality. No suspicious lytic or blastic osseous lesions. No acute displaced rib or sternal fracture. See below regarding spinal fractures. CT ABDOMEN PELVIS FINDINGS Hepatobiliary: No focal liver abnormality. No gallstones, gallbladder wall thickening, or pericholecystic fluid. No biliary dilatation. Pancreas: Diffusely atrophic. No focal lesion. Otherwise normal pancreatic contour. No surrounding inflammatory changes. No main pancreatic ductal dilatation. Spleen: Normal in size without focal abnormality. Adrenals/Urinary Tract: Hyperplasia of bilateral adrenal glands with question adenoma on within the left right. Bilateral renal cortical scarring. Subcentimeter hyperdense right renal lesion (2:60). No nephrolithiasis, no hydronephrosis, and no contour-deforming renal mass. No ureterolithiasis or hydroureter. The urinary bladder is unremarkable. Stomach/Bowel: Ascending colon surgical changes. Stomach is within normal limits. No evidence of bowel wall thickening or dilatation. Diffuse descending colon and sigmoid diverticulosis. Appendix likely surgically removed. Vascular/Lymphatic: No abdominal aorta or iliac aneurysm. Severe atherosclerotic plaque of the aorta and its branches. Prominent but nonenlarged retroperitoneal lymph nodes. No abdominal, pelvic, or inguinal  lymphadenopathy. Reproductive: Status post hysterectomy. No adnexal masses. Other: No intraperitoneal free fluid. No intraperitoneal free gas. No organized fluid collection. Musculoskeletal: Diffuse mild subcutaneus soft tissue edema no abdominal wall hernia or abnormality. No suspicious lytic or blastic osseous lesions. No acute displaced fracture. Multilevel degenerative changes of the spine. Grade 1 anterolisthesis of L4 on L5. Pubic symphysis degenerative changes. Age-indeterminate, possibly chronic, T9 compression fracture with greater than 35% height loss. Interval development of a a T12 and L1 compression fracture. Greater than a year 60% height loss at the T12 level and greater than 5% height loss at the L1 level. No retropulsion into the central canal. IMPRESSION: 1. Likely acute T12 and L1 compression fractures. Age-indeterminate T9 compression fracture. Correlate with midline point tenderness to evaluate for acuity. 2. Otherwise no acute intrathoracic, intra-abdominal, intrapelvic abnormality with limited evaluation on this noncontrast study. 3. Question esophageal wall thickening with limited evaluation on this noncontrast study. 4. Other imaging findings of potential clinical significance: Trace bilateral pleural effusions. Colonic diverticulosis with no acute diverticulitis. Indetemrinate subcentimeter hyperdense right renal lesion. Severe aortic valve leaflet and mitral annular calcifications. Aortic Atherosclerosis (ICD10-I70.0) including coronary artery calcifications. Electronically Signed   By: Iven Finn M.D.   On: 11/27/2020 23:21     Subjective: Patient was resting comfortably when seen today.  Just open eyes momentarily when called her name.  Not following much commands.  Discharge Exam: Vitals:   12/03/20 0811 12/04/20 0850  BP: (!) 155/81 (!) 145/65  Pulse: 85 76  Resp: 20   Temp: 97.6 F (36.4 C) 97.8 F (36.6 C)  SpO2: 96% 96%   Vitals:   11/30/20 1132 12/02/20 0754  12/03/20 0811 12/04/20 0850  BP: 119/67 (!) 101/44 (!) 155/81 (!) 145/65  Pulse: 78 72 85 76  Resp: 20  20   Temp: 97.7 F (36.5 C) 98 F (36.7 C) 97.6 F (36.4 C) 97.8 F (36.6 C)  TempSrc: Oral Oral Oral  Oral  SpO2: 97% 95% 96% 96%  Weight:      Height:        General: Patient was drowsy, not in acute distress Cardiovascular: RRR, S1/S2 +, no rubs, no gallops Respiratory: CTA bilaterally, no wheezing, no rhonchi Abdominal: Soft, NT, ND, bowel sounds + Extremities: no edema, no cyanosis   The results of significant diagnostics from this hospitalization (including imaging, microbiology, ancillary and laboratory) are listed below for reference.    Microbiology: Recent Results (from the past 240 hour(s))  Culture, blood (routine x 2)     Status: None   Collection Time: 11/27/20 12:36 PM   Specimen: BLOOD  Result Value Ref Range Status   Specimen Description BLOOD RIGHT ANTECUBITAL  Final   Special Requests   Final    BOTTLES DRAWN AEROBIC AND ANAEROBIC Blood Culture results may not be optimal due to an inadequate volume of blood received in culture bottles   Culture   Final    NO GROWTH 5 DAYS Performed at [facility], [address], [place], [place] [address]    Report Status 12/02/2020 FINAL  Final  Culture, blood (routine x 2)     Status: None   Collection Time: 11/27/20 12:39 PM   Specimen: BLOOD  Result Value Ref Range Status   Specimen Description BLOOD LEFT ANTECUBITAL  Final   Special Requests   Final    BOTTLES DRAWN AEROBIC AND ANAEROBIC Blood Culture adequate volume   Culture   Final    NO GROWTH 5 DAYS Performed at [facility], [address] [place], [place] [address]    Report Status 12/02/2020 FINAL  Final  Resp Panel by RT-PCR (Flu A&B, Covid) Nasopharyngeal Swab     Status: None   Collection Time: 11/27/20 12:45 PM   Specimen: Nasopharyngeal Swab; Nasopharyngeal(NP) swabs in vial transport medium  Result Value Ref Range  Status   SARS Coronavirus 2 by RT PCR NEGATIVE NEGATIVE Final    Comment: (NOTE) SARS-CoV-2 target nucleic acids are NOT DETECTED.  The SARS-CoV-2 RNA is generally detectable in upper respiratory specimens during the acute phase of infection. The lowest concentration of SARS-CoV-2 viral copies this assay can detect is 138 copies/mL. A negative result does not preclude SARS-Cov-2 infection and should not be used as the sole basis for treatment or other patient management decisions. A negative result may occur with  improper specimen collection/handling, submission of specimen other than nasopharyngeal swab, presence of viral mutation(s) within the areas targeted by this assay, and inadequate number of viral copies(<138 copies/mL). A negative result must be combined with clinical observations, patient history, and epidemiological information. The expected result is Negative.  Fact Sheet for Patients:  EntrepreneurPulse.com.au  Fact Sheet for Healthcare Providers:  IncredibleEmployment.be  This test is no t yet approved or cleared by the [place] FDA and  has been authorized for detection and/or diagnosis of SARS-CoV-2 by FDA under an Emergency Use Authorization (EUA). This EUA will remain  in effect (meaning this test can be used) for the duration of the COVID-19 declaration under Section 564(b)(1) of the Act, 21 U.S.C.section 360bbb-3(b)(1), unless the authorization is terminated  or revoked sooner.       Influenza A by PCR NEGATIVE NEGATIVE Final   Influenza B by PCR NEGATIVE NEGATIVE Final    Comment: (NOTE) The Xpert Xpress SARS-CoV-2/FLU/RSV plus assay is intended as an aid in the diagnosis of influenza from Nasopharyngeal swab specimens and should not be used as a sole  basis for treatment. Nasal washings and aspirates are unacceptable for Xpert Xpress SARS-CoV-2/FLU/RSV testing.  Fact Sheet for  Patients: EntrepreneurPulse.com.au  Fact Sheet for Healthcare Providers: IncredibleEmployment.be  This test is not yet approved or cleared by the [place] FDA and has been authorized for detection and/or diagnosis of SARS-CoV-2 by FDA under an Emergency Use Authorization (EUA). This EUA will remain in effect (meaning this test can be used) for the duration of the COVID-19 declaration under Section 564(b)(1) of the Act, 21 U.S.C. section 360bbb-3(b)(1), unless the authorization is terminated or revoked.  Performed at [facility], [address] [place], [place] [address]   MRSA PCR Screening     Status: None   Collection Time: 11/27/20  5:48 PM   Specimen: Nasopharyngeal  Result Value Ref Range Status   MRSA by PCR NEGATIVE NEGATIVE Final    Comment:        The GeneXpert MRSA Assay (FDA approved for NASAL specimens only), is one component of a comprehensive MRSA colonization surveillance program. It is not intended to diagnose MRSA infection nor to guide or monitor treatment for MRSA infections. Performed at [facility], [address] [place], [place] [address]      Labs: BNP (last 3 results) No results for input(s): BNP in the last 8760 hours. Basic Metabolic Panel: Recent Labs  Lab 11/27/20 1236 11/28/20 0436 11/29/20 0348 11/30/20 0515  NA 139 138 144 141  K 4.3 4.0 3.8 3.9  CL 103 105 113* 111  CO2 11* 17* 18* 19*  GLUCOSE 155* 272* 147* 109*  BUN 108* 107* 135* 126*  CREATININE 3.40* 3.48* 3.94* 3.71*  CALCIUM 9.1 8.3* 8.1* 8.5*  MG  --  2.2 2.1 2.2  PHOS  --  7.2* 6.4* 5.8*   Liver Function Tests: Recent Labs  Lab 11/27/20 1236 11/28/20 0436 11/29/20 0348 11/30/20 0515  AST 499* 909*  --  343*  ALT 195* 555*  --  403*  ALKPHOS 70 85  --  70  BILITOT 0.7 0.9  --  0.9  PROT 5.6* 6.0*  --  4.9*  ALBUMIN 3.1* 3.3* 2.7* 2.6*   No results for input(s): LIPASE, AMYLASE in the last 168  hours. No results for input(s): AMMONIA in the last 168 hours. CBC: Recent Labs  Lab 11/27/20 1236 11/27/20 1907 11/27/20 2356 11/28/20 0436 11/29/20 0348 11/30/20 0515  WBC 19.3*  --   --  17.8* 14.5* 11.8*  NEUTROABS 16.9*  --   --   --   --   --   HGB 4.8* 13.1 12.5 13.4 11.9* 12.0  HCT 16.0* 38.3 37.4 40.5 35.2* 36.6  MCV 79.6*  --   --  84.0 83.4 86.5  PLT 277  --   --  209 141* 122*   Cardiac Enzymes: Recent Labs  Lab 11/28/20 0436 11/30/20 0515  CKTOTAL 1,136* 105   BNP: Invalid input(s): POCBNP CBG: Recent Labs  Lab 11/30/20 0405 11/30/20 0451 11/30/20 0747 11/30/20 1146 11/30/20 1729  GLUCAP 58* 111* 87 148* 119*   D-Dimer No results for input(s): DDIMER in the last 72 hours. Hgb A1c No results for input(s): HGBA1C in the last 72 hours. Lipid Profile No results for input(s): CHOL, HDL, LDLCALC, TRIG, CHOLHDL, LDLDIRECT in the last 72 hours. Thyroid function studies No results for input(s): TSH, T4TOTAL, T3FREE, THYROIDAB in the last 72 hours.  Invalid input(s): FREET3 Anemia work up No results for input(s): VITAMINB12, FOLATE, FERRITIN, TIBC, IRON, RETICCTPCT in the  last 72 hours. Urinalysis    Component Value Date/Time   COLORURINE YELLOW (A) 11/28/2020 0528   APPEARANCEUR HAZY (A) 11/28/2020 0528   APPEARANCEUR Clear 11/09/2015 0938   LABSPEC 1.013 11/28/2020 0528   LABSPEC 1.009 03/31/2013 1216   PHURINE 5.0 11/28/2020 0528   GLUCOSEU 50 (A) 11/28/2020 0528   GLUCOSEU Negative 03/31/2013 1216   HGBUR SMALL (A) 11/28/2020 0528   BILIRUBINUR NEGATIVE 11/28/2020 0528   BILIRUBINUR Negative 11/09/2015 0938   BILIRUBINUR Negative 03/31/2013 1216   KETONESUR 5 (A) 11/28/2020 0528   PROTEINUR NEGATIVE 11/28/2020 0528   NITRITE NEGATIVE 11/28/2020 0528   LEUKOCYTESUR NEGATIVE 11/28/2020 0528   LEUKOCYTESUR Negative 03/31/2013 1216   Sepsis Labs Invalid input(s): PROCALCITONIN,  WBC,  LACTICIDVEN Microbiology Recent Results (from the past  240 hour(s))  Culture, blood (routine x 2)     Status: None   Collection Time: 11/27/20 12:36 PM   Specimen: BLOOD  Result Value Ref Range Status   Specimen Description BLOOD RIGHT ANTECUBITAL  Final   Special Requests   Final    BOTTLES DRAWN AEROBIC AND ANAEROBIC Blood Culture results may not be optimal due to an inadequate volume of blood received in culture bottles   Culture   Final    NO GROWTH 5 DAYS Performed at [facility], [address], [place], [place] [address]    Report Status 12/02/2020 FINAL  Final  Culture, blood (routine x 2)     Status: None   Collection Time: 11/27/20 12:39 PM   Specimen: BLOOD  Result Value Ref Range Status   Specimen Description BLOOD LEFT ANTECUBITAL  Final   Special Requests   Final    BOTTLES DRAWN AEROBIC AND ANAEROBIC Blood Culture adequate volume   Culture   Final    NO GROWTH 5 DAYS Performed at [facility], [address] [place], [place] [address]    Report Status 12/02/2020 FINAL  Final  Resp Panel by RT-PCR (Flu A&B, Covid) Nasopharyngeal Swab     Status: None   Collection Time: 11/27/20 12:45 PM   Specimen: Nasopharyngeal Swab; Nasopharyngeal(NP) swabs in vial transport medium  Result Value Ref Range Status   SARS Coronavirus 2 by RT PCR NEGATIVE NEGATIVE Final    Comment: (NOTE) SARS-CoV-2 target nucleic acids are NOT DETECTED.  The SARS-CoV-2 RNA is generally detectable in upper respiratory specimens during the acute phase of infection. The lowest concentration of SARS-CoV-2 viral copies this assay can detect is 138 copies/mL. A negative result does not preclude SARS-Cov-2 infection and should not be used as the sole basis for treatment or other patient management decisions. A negative result may occur with  improper specimen collection/handling, submission of specimen other than nasopharyngeal swab, presence of viral mutation(s) within the areas targeted by this assay, and inadequate number of  viral copies(<138 copies/mL). A negative result must be combined with clinical observations, patient history, and epidemiological information. The expected result is Negative.  Fact Sheet for Patients:  EntrepreneurPulse.com.au  Fact Sheet for Healthcare Providers:  IncredibleEmployment.be  This test is no t yet approved or cleared by the [place] FDA and  has been authorized for detection and/or diagnosis of SARS-CoV-2 by FDA under an Emergency Use Authorization (EUA). This EUA will remain  in effect (meaning this test can be used) for the duration of the COVID-19 declaration under Section 564(b)(1) of the Act, 21 U.S.C.section 360bbb-3(b)(1), unless the authorization is terminated  or revoked sooner.       Influenza  A by PCR NEGATIVE NEGATIVE Final   Influenza B by PCR NEGATIVE NEGATIVE Final    Comment: (NOTE) The Xpert Xpress SARS-CoV-2/FLU/RSV plus assay is intended as an aid in the diagnosis of influenza from Nasopharyngeal swab specimens and should not be used as a sole basis for treatment. Nasal washings and aspirates are unacceptable for Xpert Xpress SARS-CoV-2/FLU/RSV testing.  Fact Sheet for Patients: EntrepreneurPulse.com.au  Fact Sheet for Healthcare Providers: IncredibleEmployment.be  This test is not yet approved or cleared by the [place] FDA and has been authorized for detection and/or diagnosis of SARS-CoV-2 by FDA under an Emergency Use Authorization (EUA). This EUA will remain in effect (meaning this test can be used) for the duration of the COVID-19 declaration under Section 564(b)(1) of the Act, 21 U.S.C. section 360bbb-3(b)(1), unless the authorization is terminated or revoked.  Performed at [facility], [address] [place], [place] [address]   MRSA PCR Screening     Status: None   Collection Time: 11/27/20  5:48 PM   Specimen: Nasopharyngeal  Result  Value Ref Range Status   MRSA by PCR NEGATIVE NEGATIVE Final    Comment:        The GeneXpert MRSA Assay (FDA approved for NASAL specimens only), is one component of a comprehensive MRSA colonization surveillance program. It is not intended to diagnose MRSA infection nor to guide or monitor treatment for MRSA infections. Performed at [facility], [address] [place], [place] [address]     Time coordinating discharge: Over 30 minutes  SIGNED:  Lorella Nimrod, MD  [facility] 12/04/2020, 12:32 PM  If 7PM-7AM, please contact night-coverage [email or web address]  This record has been created using Systems analyst. Errors have been sought and corrected,but may not always be located. Such creation errors do not reflect on the standard of care.

## 2020-12-04 NOTE — Progress Notes (Signed)
Pt transported to [facility] via EMS per stretcher.  All belongings were taken home by the pt's daughter.

## 2020-12-04 NOTE — Progress Notes (Signed)
Report called to [place] at the hospice house.

## 2020-12-04 NOTE — Progress Notes (Signed)
Met daughter coming in this morning. She let me know her mother was still hanging on. She says she believes she hears her brother each time he tries awaking her. She knows it will be very difficult to let her go, but knows she is ready to rest. Followed up 1:00, sister and brother bedside, mother will be moved to Hospice location today at some point.

## 2021-01-08 DEATH — deceased
